# Patient Record
Sex: Male | Born: 1990 | Race: Black or African American | Hispanic: No | Marital: Single | Smoking: Never smoker
Health system: Southern US, Community
[De-identification: ages and names within clinical notes are randomized; demographics above are authoritative.]

## PROBLEM LIST (undated history)

## (undated) ENCOUNTER — Ambulatory Visit: Admission: EM | Payer: BC Managed Care – PPO | Source: Home / Self Care

## (undated) DIAGNOSIS — J45909 Unspecified asthma, uncomplicated: Secondary | ICD-10-CM

## (undated) DIAGNOSIS — E739 Lactose intolerance, unspecified: Secondary | ICD-10-CM

## (undated) DIAGNOSIS — E669 Obesity, unspecified: Secondary | ICD-10-CM

## (undated) DIAGNOSIS — I2699 Other pulmonary embolism without acute cor pulmonale: Secondary | ICD-10-CM

## (undated) HISTORY — PX: NO PAST SURGERIES: SHX2092

---

## 2012-04-08 ENCOUNTER — Emergency Department (HOSPITAL_COMMUNITY)
Admission: EM | Admit: 2012-04-08 | Discharge: 2012-04-08 | Disposition: A | Discharge status: Home / Self Care | Payer: Medicaid Other | Attending: Emergency Medicine | Admitting: Emergency Medicine

## 2012-04-08 ENCOUNTER — Encounter (HOSPITAL_COMMUNITY): Payer: Self-pay | Admitting: Nurse Practitioner

## 2012-04-08 DIAGNOSIS — I889 Nonspecific lymphadenitis, unspecified: Secondary | ICD-10-CM | POA: Insufficient documentation

## 2012-04-08 MED ORDER — CEPHALEXIN 500 MG PO CAPS: 500.0000 mg | ORAL_CAPSULE | Freq: Four times a day (QID) | ORAL | Status: AC

## 2012-04-08 NOTE — ED Provider Notes (Signed)
History     CSN: [id number]  Arrival date & time 04/08/12  1158   First MD Initiated Contact with Patient 04/08/12 1231      Chief Complaint  Patient presents with  . Leg Pain     HPI Pt states he has been experiencing aching in his legs for past year. No known injuries. Reports L leg is especially painful over past several days. State he can "feel a lump" under skin in L anterior thigh. Ambulatory, [facility]  History reviewed. No pertinent past medical history.  History reviewed. No pertinent past surgical history.  History reviewed. No pertinent family history.  History  Substance Use Topics  . Smoking status: Never Smoker   . Smokeless tobacco: Not on file  . Alcohol Use: No      Review of Systems  All other systems reviewed and are negative.    Allergies  Review of patient's allergies indicates no known allergies.  Home Medications   Current Outpatient Rx  Name Route Sig Dispense Refill  . ALBUTEROL SULFATE HFA 108 (90 BASE) MCG/ACT IN AERS Inhalation Inhale 2 puffs into the lungs every 4 (four) hours as needed. For shortness of breath    . OMEPRAZOLE 20 MG PO CPDR Oral Take 20 mg by mouth daily as needed. For reflux    . CEPHALEXIN 500 MG PO CAPS Oral Take 1 capsule (500 mg total) by mouth 4 (four) times daily. 40 capsule 0    BP 114/82  Pulse 69  Temp(Src) 98.1 F (36.7 C) (Oral)  Resp 15  Ht 5\' 10"  (1.778 m)  Wt 210 lb (95.255 kg)  BMI 30.13 kg/m2  SpO2 97%  Physical Exam  Nursing note and vitals reviewed. Constitutional: He is oriented to person, place, and time. He appears well-developed and well-nourished. No distress.  HENT:  Head: Normocephalic and atraumatic.  Eyes: Pupils are equal, round, and reactive to light.  Neck: Normal range of motion.  Cardiovascular: Normal rate and intact distal pulses.   Pulmonary/Chest: No respiratory distress.  Abdominal: Normal appearance. He exhibits no distension.  Musculoskeletal: Normal range of motion.       Legs: Neurological: He is alert and oriented to person, place, and time. No cranial nerve deficit.  Skin: Skin is warm and dry. No rash noted.  Psychiatric: He has a normal mood and affect. His behavior is normal.    ED Course  Procedures (including critical care time)  Labs Reviewed - No data to display No results found.   1. Lymphadenitis       MDM          Nelia Shi, MD 04/10/12 1149

## 2012-04-08 NOTE — ED Notes (Signed)
Pt c/o bilateral leg pain, x 1 year. Pt has small bump to upper left thigh that is under skin. Bilateral legs are non-tender, no redness noted to skin. Pt ambulated to room without difficulty.

## 2012-04-08 NOTE — Discharge Instructions (Signed)
Swollen Lymph Nodes  The lymphatic system filters fluid from around cells. It is like a system of blood vessels. These channels carry lymph instead of blood. The lymphatic system is an important part of the immune (disease fighting) system. When people talk about "swollen glands in the neck," they are usually talking about swollen lymph nodes. The lymph nodes are like the little traps for infection. You and your caregiver may be able to feel lymph nodes, especially swollen nodes, in these common areas: the groin (inguinal area), armpits (axilla), and above the clavicle (supraclavicular). You may also feel them in the neck (cervical) and the back of the head just above the hairline (occipital).  Swollen glands occur when there is any condition in which the body responds with an allergic type of reaction. For instance, the glands in the neck can become swollen from insect bites or any type of minor infection on the head. These are very noticeable in children with only minor problems. Lymph nodes may also become swollen when there is a tumor or problem with the lymphatic system, such as Hodgkin's disease.  TREATMENT    Most swollen glands do not require treatment. They can be observed (watched) for a short period of time, if your caregiver feels it is necessary. Most of the time, observation is not necessary.   Antibiotics (medicines that kill germs) may be prescribed by your caregiver. Your caregiver may prescribe these if he or she feels the swollen glands are due to a bacterial (germ) infection. Antibiotics are not used if the swollen glands are caused by a virus.  HOME CARE INSTRUCTIONS    Take medications as directed by your caregiver. Only take over-the-counter or prescription medicines for pain, discomfort, or fever as directed by your caregiver.  SEEK MEDICAL CARE IF:    If you begin to run a temperature greater than 102 F (38.9 C), or as your caregiver suggests.  MAKE SURE YOU:    Understand these  instructions.   Will watch your condition.   Will get help right away if you are not doing well or get worse.  Document Released: 12/05/2002 Document Revised: 12/04/2011 Document Reviewed: 12/15/2005  Exit[employer] Patient Information 2012 [employer].

## 2012-04-08 NOTE — ED Notes (Signed)
Pt states he has been experiencing aching in his legs for past year. No known injuries. Reports L leg is especially painful over past several days. State he can "feel a lump" under skin in L anterior thigh. Ambulatory, [facility]

## 2012-04-20 ENCOUNTER — Encounter (HOSPITAL_COMMUNITY): Payer: Self-pay | Admitting: *Deleted

## 2012-04-20 ENCOUNTER — Emergency Department (HOSPITAL_COMMUNITY)
Admission: EM | Admit: 2012-04-20 | Discharge: 2012-04-20 | Disposition: A | Discharge status: Home / Self Care | Payer: Medicaid Other | Attending: Emergency Medicine | Admitting: Emergency Medicine

## 2012-04-20 DIAGNOSIS — M79606 Pain in leg, unspecified: Secondary | ICD-10-CM

## 2012-04-20 DIAGNOSIS — M79609 Pain in unspecified limb: Secondary | ICD-10-CM | POA: Insufficient documentation

## 2012-04-20 NOTE — ED Provider Notes (Signed)
History   This chart was scribed for Lyanne Co, MD by Clarita Crane. The patient was seen in room STRE4/STRE4. Patient's care was started at 1546.    CSN: [id number]  Arrival date & time 04/20/12  1546   First MD Initiated Contact with Patient 04/20/12 1620      Chief Complaint  Patient presents with  . lumps on legs     HPI Terry Miles is a 21 y.o. male who presents to the Emergency Department complaining of intermittent moderate pain to bilateral lower extremities onset 2 years ago and persistent since. Patient also notes he palpated a lump on his right thigh yesterday. Denies numbness, tingling, weakness, rash, nausea, vomiting, fever, chills.  Patient reports his pain occurs in different areas of his legs and seems to frequently move.  History reviewed. No pertinent past medical history.  History reviewed. No pertinent past surgical history.  No family history on file.  History  Substance Use Topics  . Smoking status: Never Smoker   . Smokeless tobacco: Not on file  . Alcohol Use: No      Review of Systems  Constitutional: Negative for fever and chills.  Respiratory: Negative for shortness of breath.   Gastrointestinal: Negative for nausea and vomiting.  Musculoskeletal: Positive for myalgias. Negative for arthralgias.  Neurological: Negative for weakness and numbness.  All other systems reviewed and are negative.    Allergies  Review of patient's allergies indicates no known allergies.  Home Medications   Current Outpatient Rx  Name Route Sig Dispense Refill  . ALBUTEROL SULFATE HFA 108 (90 BASE) MCG/ACT IN AERS Inhalation Inhale 2 puffs into the lungs every 4 (four) hours as needed. For shortness of breath    . OMEPRAZOLE 20 MG PO CPDR Oral Take 20 mg by mouth daily as needed. For reflux      BP 110/62  Pulse 60  Temp(Src) 98.2 F (36.8 C) (Oral)  Resp 16  SpO2 99%  Physical Exam  Nursing note and vitals reviewed. Constitutional: He is  oriented to person, place, and time. He appears well-developed and well-nourished. No distress.  HENT:  Head: Normocephalic and atraumatic.  Eyes: EOM are normal. Pupils are equal, round, and reactive to light.  Neck: Neck supple. No tracheal deviation present.  Cardiovascular: Normal rate.   Pulmonary/Chest: Effort normal. No respiratory distress.  Abdominal: Soft. He exhibits no distension.  Musculoskeletal: Normal range of motion. He exhibits no edema.       FROM bilateral lower extremities. DP and PT pulses intact bilaterally. No signs of infection noted to lower extremities. Small punctate mobile lymph node to left groin but no other palpable masses noted to bilateral lower extremities. Bilateral lower extremities are symmetric with no swelling noted.   Neurological: He is alert and oriented to person, place, and time. No sensory deficit.  Skin: Skin is warm and dry. No erythema.  Psychiatric: He has a normal mood and affect. His behavior is normal.    ED Course  Procedures (including critical care time)  DIAGNOSTIC STUDIES: Oxygen Saturation is 99% on room air, normal by my interpretation.    COORDINATION OF CARE: 4:48PM- Patient informed of current plan for treatment and evaluation and agrees with plan at this time.     Labs Reviewed - No data to display No results found.   1. Lower extremity pain       MDM  There is no sign of arterial or venous insufficiency.  He has no signs  of infection.  His legs are normal in appearance.  Medical screening examination performed and there is no life-threatening emergency presents today.  I recommended close followup with her primary care physician      I personally performed the services described in this documentation, which was scribed in my presence. The recorded information has been reviewed and considered.      Lyanne Co, MD 04/20/12 [phone]

## 2012-04-20 NOTE — ED Notes (Signed)
The pt says he has lumps on both his legs and a lump on his lt forearm that he has had for 3-4 weeks intermittently.Terry Miles  He was seen here for the same and was given meds for infectiion

## 2012-04-20 NOTE — Discharge Instructions (Signed)
Pain of Unknown Etiology (Pain Without a Known Cause) You have come to your caregiver because of pain. Pain can occur in any part of the body. Often there is not a definite cause. If your laboratory (blood or urine) work was normal and x-rays or other studies were normal, your caregiver may treat you without knowing the cause of the pain. An example of this is the headache. Most headaches are diagnosed by taking a history. This means your caregiver asks you questions about your headaches. Your caregiver determines a treatment based on your answers. Usually testing done for headaches is normal. Often testing is not done unless there is no response to medications. Regardless of where your pain is located today, you can be given medications to make you comfortable. If no physical cause of pain can be found, most cases of pain will gradually leave as suddenly as they came.  If you have a painful condition and no reason can be found for the pain, It is importantthat you follow up with your caregiver. If the pain becomes worse or does not go away, it may be necessary to repeat tests and look further for a possible cause.  Only take over-the-counter or prescription medicines for pain, discomfort, or fever as directed by your caregiver.   For the protection of your privacy, test results can not be given over the phone. Make sure you receive the results of your test. Ask as to how these results are to be obtained if you have not been informed. It is your responsibility to obtain your test results.   You may continue all activities unless the activities cause more pain. When the pain lessens, it is important to gradually resume normal activities. Resume activities by beginning slowly and gradually increasing the intensity and duration of the activities or exercise. During periods of severe pain, bed-rest may be helpful. Lay or sit in any position that is comfortable.   Ice used for acute (sudden) conditions may be  effective. Use a large plastic bag filled with ice and wrapped in a towel. This may provide pain relief.   See your caregiver for continued problems. They can help or refer you for exercises or physical therapy if necessary.  If you were given medications for your condition, do not drive, operate machinery or power tools, or sign legal documents for 24 hours. Do not drink alcohol, take sleeping pills, or take other medications that may interfere with treatment. See your caregiver immediately if you have pain that is becoming worse and not relieved by medications. Document Released: 09/09/2001 Document Revised: 12/04/2011 Document Reviewed: 12/15/2005 [employer] Patient Information 2012 [employer]. 

## 2012-08-17 LAB — PULMONARY FUNCTION TEST

## 2012-09-06 ENCOUNTER — Other Ambulatory Visit: Payer: Self-pay | Admitting: Internal Medicine

## 2012-09-06 ENCOUNTER — Encounter: Payer: Self-pay | Admitting: *Deleted

## 2012-09-06 DIAGNOSIS — I2510 Atherosclerotic heart disease of native coronary artery without angina pectoris: Secondary | ICD-10-CM

## 2012-09-15 ENCOUNTER — Ambulatory Visit (HOSPITAL_COMMUNITY)
Admission: RE | Admit: 2012-09-15 | Discharge: 2012-09-15 | Disposition: A | Discharge status: Home / Self Care | Payer: Medicaid Other | Source: Ambulatory Visit | Attending: Internal Medicine | Admitting: Internal Medicine

## 2012-09-15 ENCOUNTER — Telehealth: Payer: Self-pay | Admitting: *Deleted

## 2012-09-15 DIAGNOSIS — N62 Hypertrophy of breast: Secondary | ICD-10-CM | POA: Insufficient documentation

## 2012-09-15 DIAGNOSIS — R911 Solitary pulmonary nodule: Secondary | ICD-10-CM | POA: Insufficient documentation

## 2012-09-15 DIAGNOSIS — I2510 Atherosclerotic heart disease of native coronary artery without angina pectoris: Secondary | ICD-10-CM

## 2012-09-15 DIAGNOSIS — R079 Chest pain, unspecified: Secondary | ICD-10-CM | POA: Insufficient documentation

## 2012-09-15 MED ORDER — IOHEXOL 350 MG/ML SOLN
80.0000 mL | Freq: Once | INTRAVENOUS | Status: AC | PRN
  Administered 2012-09-15: 80 mL via INTRAVENOUS

## 2012-09-15 MED ORDER — METOPROLOL TARTRATE 1 MG/ML IV SOLN: 5.0000 mg | Freq: Once | INTRAVENOUS | Status: DC

## 2012-09-15 MED ORDER — NITROGLYCERIN 0.4 MG SL SUBL
SUBLINGUAL_TABLET | SUBLINGUAL | Status: AC
  Filled 2012-09-15: qty 25

## 2012-09-15 MED ORDER — METOPROLOL TARTRATE 1 MG/ML IV SOLN
INTRAVENOUS | Status: AC
  Administered 2012-09-15: 5 mg
  Filled 2012-09-15: qty 5

## 2012-09-15 MED ORDER — NITROGLYCERIN 0.4 MG SL SUBL
0.4000 mg | SUBLINGUAL_TABLET | Freq: Once | SUBLINGUAL | Status: AC
  Administered 2012-09-15: 0.4 mg via SUBLINGUAL

## 2012-09-15 NOTE — Telephone Encounter (Addendum)
Spoke with Terry Miles 09/14/12 concerning Cardiac CT appointment. An E-mail correspondence has been sent to the patient  Verify the change in his appointment. Patient agreed. See E-mail below.  Terry Miles, I am so sorry, but we had to change your appointment time on 09/15/12 from 1pm to 2pm. Instead of   fasting after 9 am, fast after 10 am.

## 2012-09-16 DIAGNOSIS — R079 Chest pain, unspecified: Secondary | ICD-10-CM

## 2013-07-07 ENCOUNTER — Ambulatory Visit: Payer: Self-pay | Admitting: Nurse Practitioner

## 2015-04-22 ENCOUNTER — Ambulatory Visit
Admit: 2015-04-22 | Disposition: A | Discharge status: Home / Self Care | Payer: Self-pay | Attending: Family Medicine | Admitting: Family Medicine

## 2015-04-23 ENCOUNTER — Ambulatory Visit
Admit: 2015-04-23 | Disposition: A | Discharge status: Home / Self Care | Payer: Self-pay | Attending: Internal Medicine | Admitting: Internal Medicine

## 2015-07-17 ENCOUNTER — Encounter: Payer: Self-pay | Admitting: *Deleted

## 2015-07-27 ENCOUNTER — Encounter: Payer: Self-pay | Admitting: Emergency Medicine

## 2015-07-27 ENCOUNTER — Ambulatory Visit
Admission: EM | Admit: 2015-07-27 | Discharge: 2015-07-27 | Discharge status: Absent without leave | Payer: Medicaid Other

## 2015-07-27 ENCOUNTER — Ambulatory Visit
Admission: EM | Admit: 2015-07-27 | Discharge: 2015-07-27 | Disposition: A | Discharge status: Home / Self Care | Payer: BLUE CROSS/BLUE SHIELD | Attending: Emergency Medicine | Admitting: Emergency Medicine

## 2015-07-27 DIAGNOSIS — R1900 Intra-abdominal and pelvic swelling, mass and lump, unspecified site: Secondary | ICD-10-CM | POA: Diagnosis not present

## 2015-07-27 NOTE — ED Provider Notes (Signed)
HPI  SUBJECTIVE:  Terry Miles is a 24 y.o. male who presents with nontender mass in his abdominal wall for at least 2 months. He states that it is not changed in size since he noticed it. There are no aggravating or alleviating factors. He has not tried anything for this. No nausea, vomiting, fevers, trauma to the area, erythema, no drainage. He reports sharp, occasional, migratory abdominal pain, but states that he has had this "forever". States that it is not different than his baseline. Past medical history of palpitations. No history of diabetes, hypertension.   History reviewed. No pertinent past medical history.  History reviewed. No pertinent past surgical history.  History reviewed. No pertinent family history.  History  Substance Use Topics  . Smoking status: Never Smoker   . Smokeless tobacco: Not on file  . Alcohol Use: No    No current facility-administered medications for this encounter. No current outpatient prescriptions on file.  Allergies  Allergen Reactions  . Shellfish Allergy      ROS  As noted in HPI.   Physical Exam  BP 116/58 mmHg  Pulse 58  Temp(Src) 98.1 F (36.7 C) (Oral)  Resp 16  SpO2 100%  Constitutional: Well developed, well nourished, no acute distress Eyes:  EOMI, conjunctiva normal bilaterally HENT: Normocephalic, atraumatic,mucus membranes moist Respiratory: Normal inspiratory effort Cardiovascular: Normal rate GI: Approximately 0.5 x 1 cm nontender mass subcutaneous tissue lateral left abdomen. Normal appearance. no erythema, swelling, expressible purulent drainage. Abdomen otherwise soft, nontender nondistended, normal bowel sounds. skin: No rash, skin intact Musculoskeletal: no deformities Neurologic: Alert & oriented x 3, no focal neuro deficits Psychiatric: Speech and behavior appropriate   ED Course   Medications - No data to display  No orders of the defined types were placed in this encounter.    No  results found for this or any previous visit (from the past 24 hour(s)). No results found.  ED Clinical Impression  Mass of abdomen   ED Assessment/Plan Feel that this is either a lipoma or small amounts of scar tissue/fibrosis. There is no evidence of a hernia. Since this is largely asymptomatic, advised patient to monitor this. Feel that labs, imaging would be of limited use at this time. He is to follow-up with his primary care physician if it starts becoming larger, for any signs of infection. Discussed MDM, plan and followup with patient.  Patient agrees with plan.  *This clinic note was created using Dragon dictation software. Therefore, there may be occasional mistakes despite careful proofreading.  ?   Domenick Gong, MD 07/27/15 1312

## 2015-07-27 NOTE — ED Notes (Signed)
Pt has a small lump on left side of abdomen pt states that it has been there for 2 months at least.

## 2015-07-27 NOTE — Discharge Instructions (Signed)
This is most likely a lipoma or a small amount of scar tissue, both of which are benign, and do not require further evaluation and intervention at this time. Here is some information about lipomas. Follow-up with your primary care physician if it starts getting bigger, painful, red, if you start having drainage from the area, or other concerns.

## 2015-07-30 ENCOUNTER — Encounter: Payer: Self-pay | Admitting: *Deleted

## 2015-08-07 ENCOUNTER — Encounter: Payer: Self-pay | Admitting: Internal Medicine

## 2016-10-10 ENCOUNTER — Ambulatory Visit
Admission: EM | Admit: 2016-10-10 | Discharge: 2016-10-10 | Disposition: A | Discharge status: Home / Self Care | Payer: BLUE CROSS/BLUE SHIELD | Attending: Family Medicine | Admitting: Family Medicine

## 2016-10-10 DIAGNOSIS — H6122 Impacted cerumen, left ear: Secondary | ICD-10-CM

## 2016-10-10 NOTE — ED Provider Notes (Signed)
[facility] ____________________________________________  Time seen: Approximately 9:11 AM  I have reviewed the triage vital signs and the nursing notes.   HISTORY  Chief Complaint Ear Fullness (Left Ear)   HPI Terry Miles is a 25 y.o. male presents with a complaint of left ear feeling clogged. Patient states his hearing is left ear feels muffled. Patient reports that he does often have wax buildup in his ears that feels similarly. Denies pain. Denies ringing in his ears. Denies recent cough, congestion, fevers or sickness. Denies dizziness. Patient reports that he feels well otherwise. Patient reports that he did try using an over-the-counter kit that he got from [facility] that involved drops and trying to suction wax out without any improvement.    History reviewed. No pertinent past medical history.  There are no active problems to display for this patient.   History reviewed. No pertinent surgical history.  Current Outpatient Rx  . Order #: [id number] Class: Historical Med  . Order #: [id number] Class: Historical Med    No current facility-administered medications for this encounter.   Current Outpatient Prescriptions:  .  omeprazole (PRILOSEC) 20 MG capsule, Take 20 mg by mouth daily as needed. For reflux, Disp: , Rfl:  .  albuterol (PROVENTIL HFA;VENTOLIN HFA) 108 (90 BASE) MCG/ACT inhaler, Inhale 2 puffs into the lungs every 4 (four) hours as needed. For shortness of breath, Disp: , Rfl:   Allergies Shellfish allergy and Shrimp [shellfish allergy]  Family History  Problem Relation Age of Onset  . Hypertension Maternal Grandmother   . Heart disease Maternal Grandmother   . Kidney disease Maternal Grandmother     Social History Social History  Substance Use Topics  . Smoking status: Never Smoker  . Smokeless tobacco: Never Used  . Alcohol use No    Review of Systems Constitutional: No fever/chills Eyes: No visual changes. ENT: No sore throat. As  above. Cardiovascular: Denies chest pain. Respiratory: Denies shortness of breath. Gastrointestinal: No abdominal pain.  No nausea, no vomiting.  No diarrhea.  No constipation. Genitourinary: Negative for dysuria. Musculoskeletal: Negative for back pain. Skin: Negative for rash. Neurological: Negative for headaches, focal weakness or numbness.  10-point ROS otherwise negative.  ____________________________________________   PHYSICAL EXAM:  VITAL SIGNS: ED Triage Vitals  Enc Vitals Group     BP 10/10/16 0905 (!) 118/58     Pulse Rate 10/10/16 0905 60     Resp 10/10/16 0905 16     Temp 10/10/16 0905 98.1 F (36.7 C)     Temp Source 10/10/16 0905 Oral     SpO2 10/10/16 0905 99 %     Weight 10/10/16 0904 220 lb (99.8 kg)     Height 10/10/16 0904 '5\' 11"'$  (1.803 m)     Head Circumference --      Peak Flow --      Pain Score 10/10/16 0904 0     Pain Loc --      Pain Edu? --      Excl. in [place]? --     Constitutional: Alert and oriented. Well appearing and in no acute distress. Eyes: Conjunctivae are normal. PERRL. EOMI. ENT      Head: Normocephalic and atraumatic.      Ears: Right: nontender, mild cerumen present, no erythema, normal TM. Left: nontender, left total cerumen impaction, post cerumen removal canal clear, no erythema  And normal TM.       Nose: No congestion/rhinnorhea.      Mouth/Throat: Mucous membranes are moist.Oropharynx  non-erythematous. Neck: No stridor. Supple without meningismus.  Hematological/Lymphatic/Immunilogical: No cervical lymphadenopathy. Cardiovascular: Normal rate, regular rhythm. Grossly normal heart sounds.  Good peripheral circulation. Respiratory: Normal respiratory effort without tachypnea nor retractions. Breath sounds are clear and equal bilaterally. No wheezes/rales/rhonchi.. Musculoskeletal:  Ambulatory with steady gait. Neurologic:  Normal speech and language. Skin:  Skin is warm, dry and intact. No rash noted. Psychiatric: Mood and  affect are normal. Speech and behavior are normal. Patient exhibits appropriate insight and judgment   ___________________________________________   LABS (all labs ordered are listed, but only abnormal results are displayed)  Labs Reviewed - No data to display ____________________________________________  PROCEDURES Procedures   Ceruminosis is noted.  Wax is removed by irrigation by RN. Instructions for home care to prevent wax buildup are given.  INITIAL IMPRESSION / ASSESSMENT AND PLAN / ED COURSE  Pertinent labs & imaging results that were available during my care of the patient were reviewed by me and considered in my medical decision making (see chart for details).  Well appearing patient. No acute distress. Presents for the complaints of left ear fullness and clogged sensation. Left total cerumen impaction. After cerumen impaction removal with irrigation, canal clear and patient reports feeling much better.  Discussed follow up with Primary care physician this week. Discussed follow up and return parameters including no resolution or any worsening concerns. Patient verbalized understanding and agreed to plan.   ____________________________________________   FINAL CLINICAL IMPRESSION(S) / ED DIAGNOSES  Final diagnoses:  Impacted cerumen of left ear     New Prescriptions   No medications on file    Note: This dictation was prepared with Dragon dictation along with smaller phrase technology. Any transcriptional errors that result from this process are unintentional.    Clinical Course      Marylene Land, NP 10/10/16 [phone]

## 2016-10-10 NOTE — ED Triage Notes (Signed)
Pt c/o not being able to hear out of left ear. Believes its full of wax

## 2016-10-17 IMAGING — CR DG HAND COMPLETE 3+V*L*
3 series · 3 of 3 positions shown · non-contrast
Comparison: None.

CLINICAL DATA: Chronic pain along palmar aspect left hand

EXAM:
LEFT HAND - COMPLETE 3+ VIEW

[hand ap]
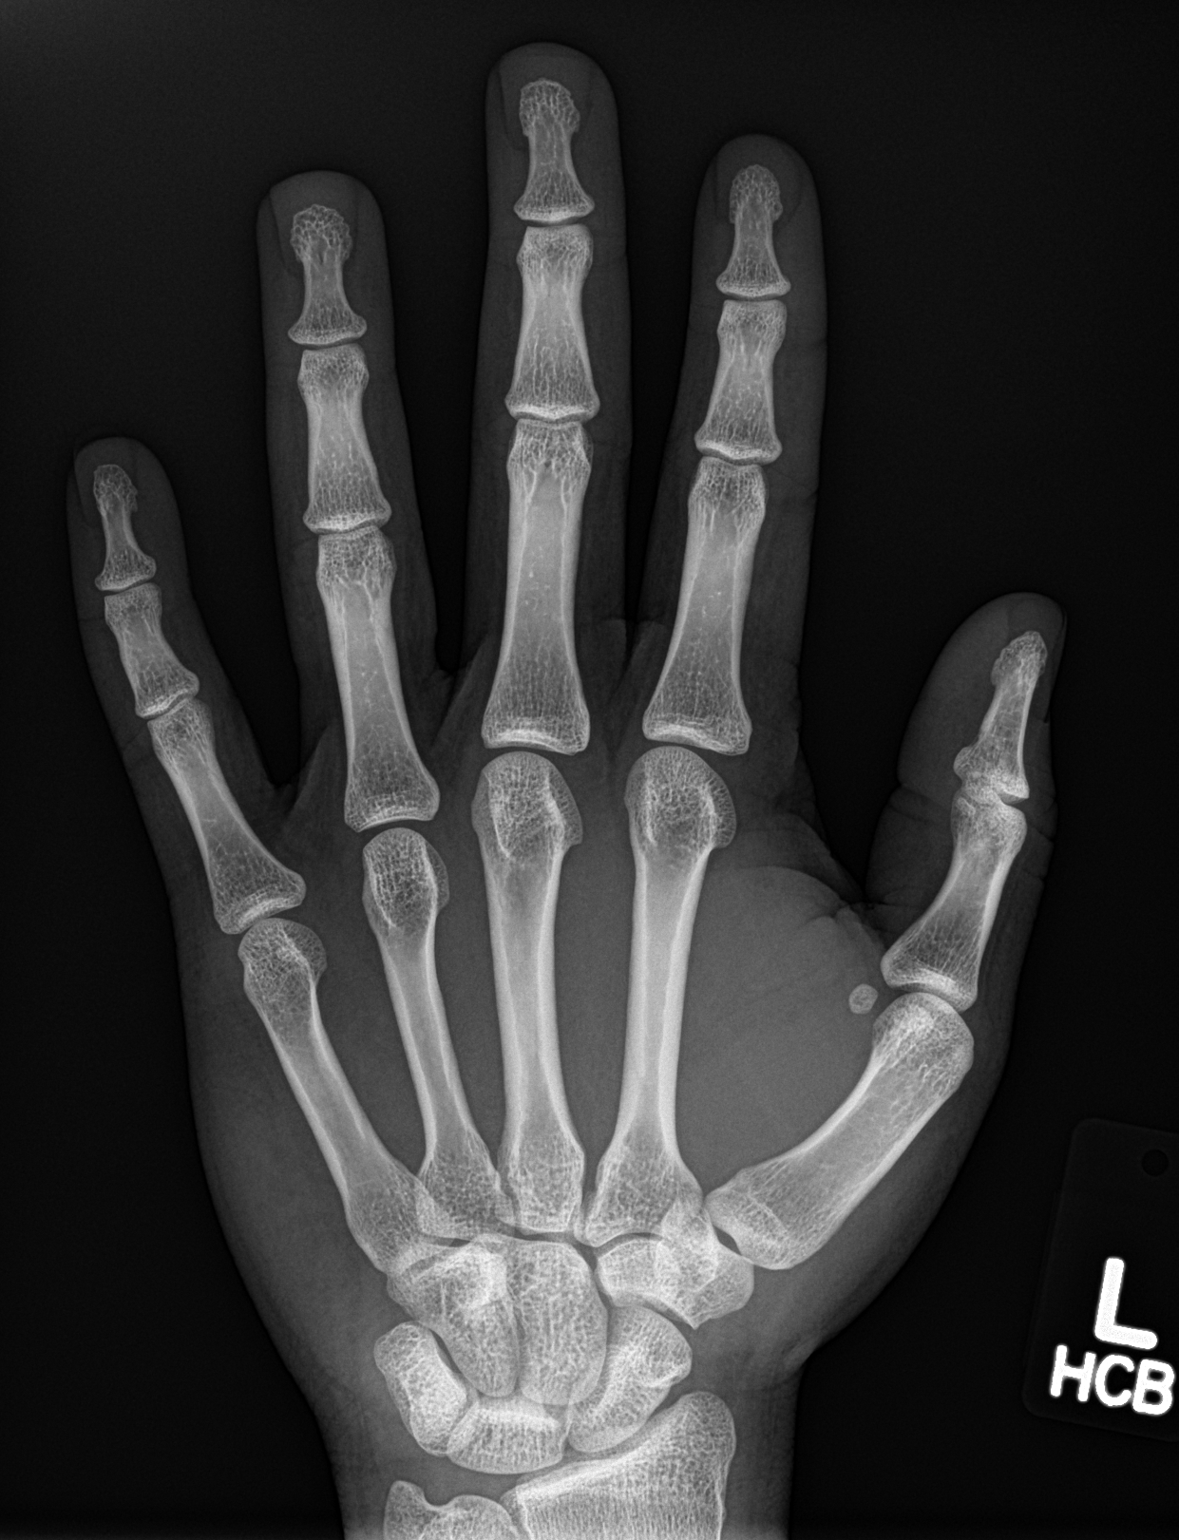

[hand obl]
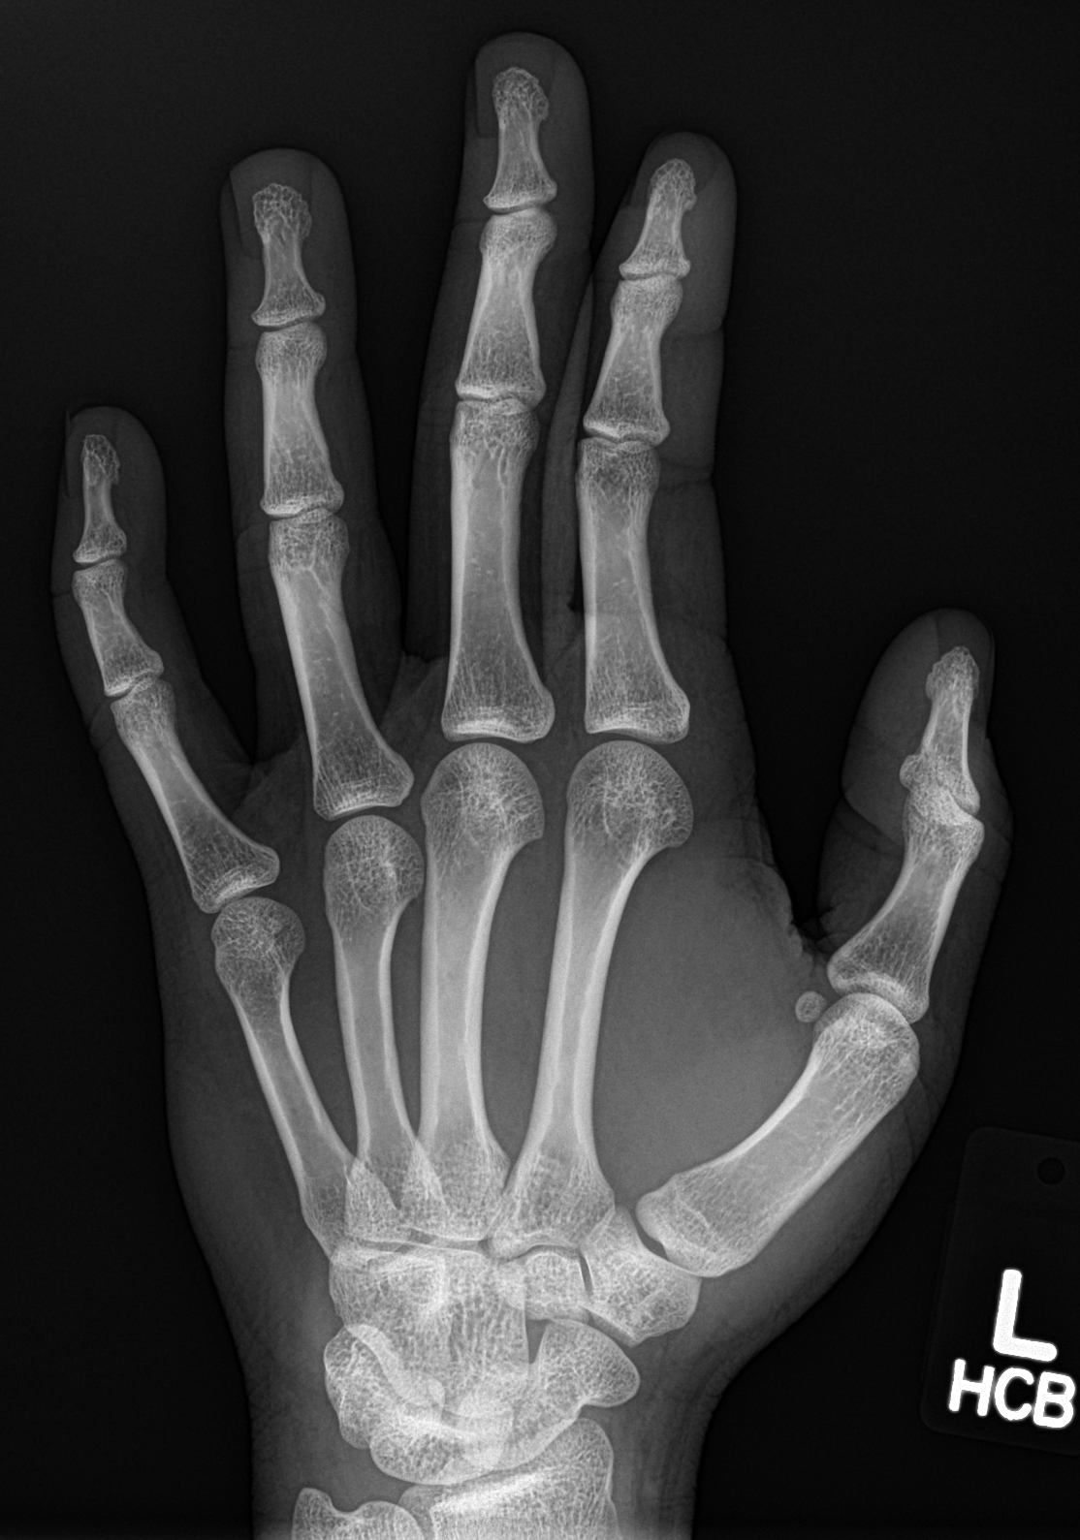

[hand lat]
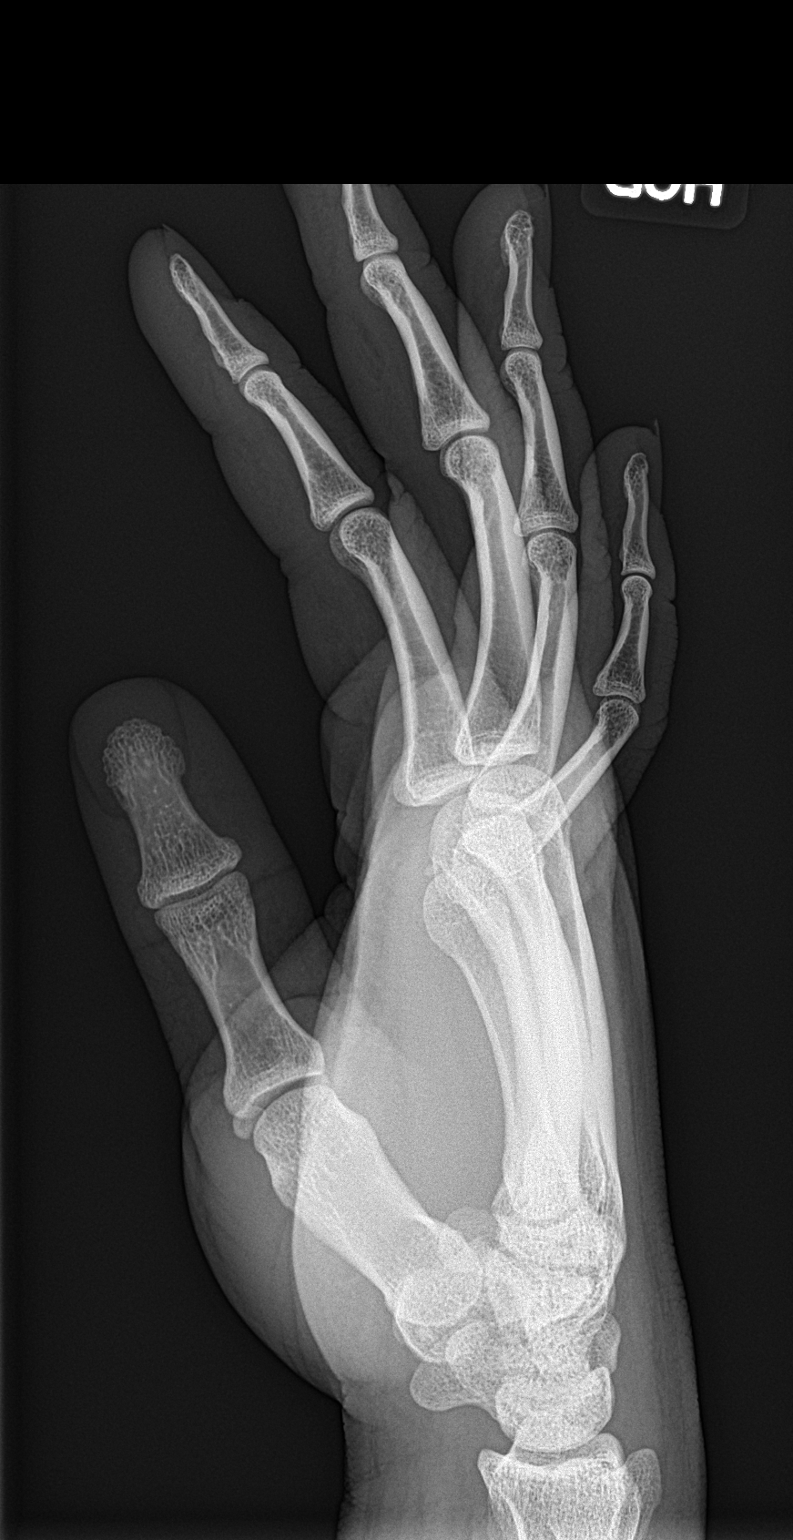

[3 of 3 positions shown; findings below may reference images not displayed]

FINDINGS: Frontal, oblique, and lateral views obtained. There is no fracture
or dislocation. Joint spaces appear intact. No erosive change. No
soft tissue mass or calcifications seen.
IMPRESSION: No abnormality noted.

## 2016-10-17 IMAGING — CR DG FOREARM 2V*L*
2 series · 2 of 2 positions shown · non-contrast
Comparison: None.

CLINICAL DATA: Soft tissue prominence left anterior forearm. Pain
with lifting

EXAM:
LEFT FOREARM - 2 VIEW

[forearm ap]
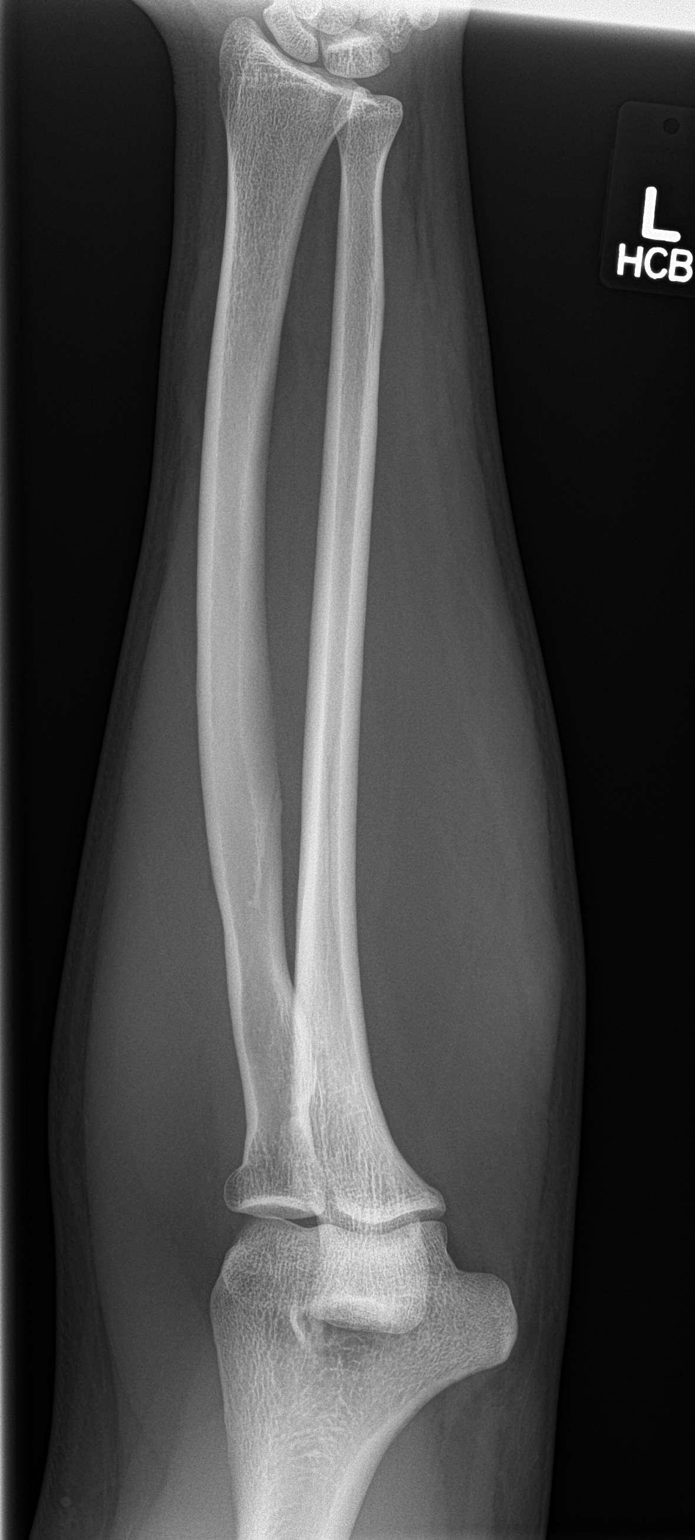

[forearm lat]
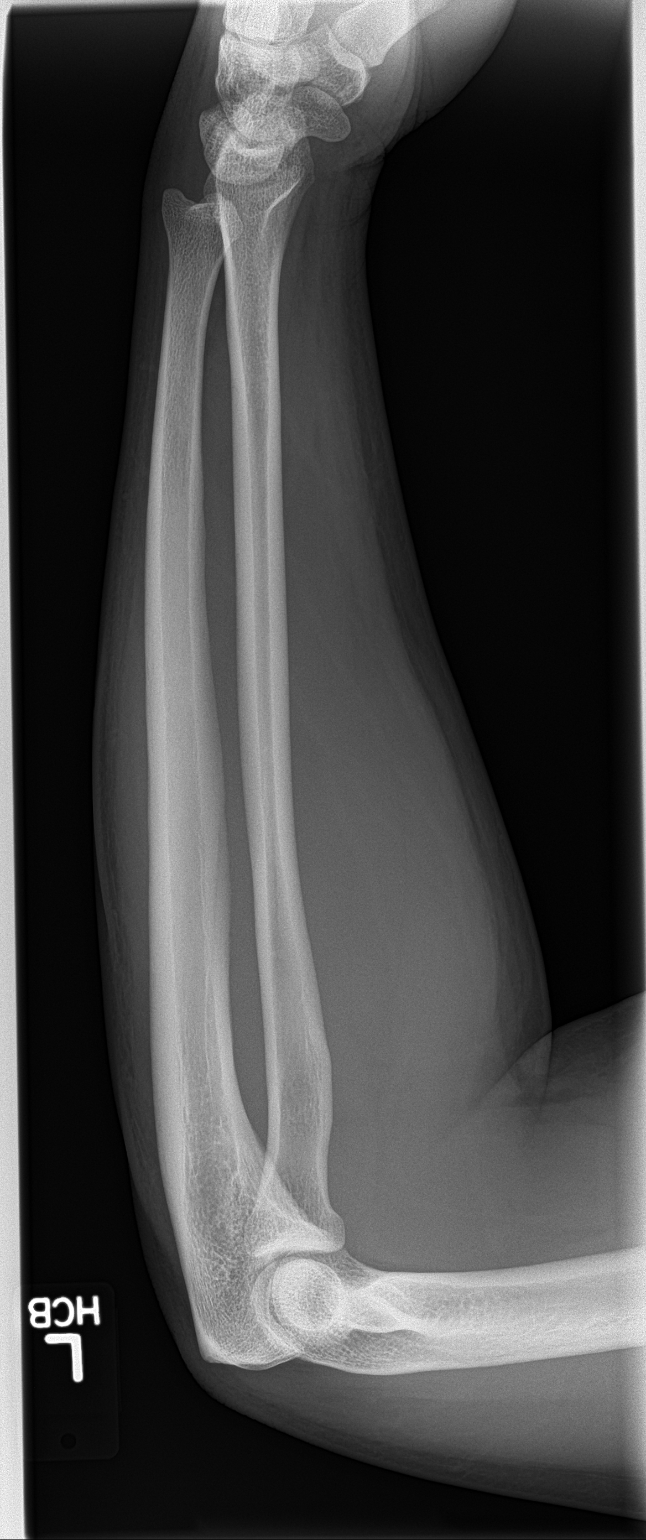

[2 of 2 positions shown; findings below may reference images not displayed]

FINDINGS: Frontal and lateral views were obtained. No fracture or dislocation.
Joint spaces appear intact. Incidental note is made of a minus ulnar
variant. No soft tissue mass or calcifications seen.
IMPRESSION: No fracture or dislocation. No appreciable arthropathy. No soft
tissue mass appreciable. MR would be the imaging study of choice to
further evaluate soft tissue mass in this region.

## 2016-11-28 ENCOUNTER — Ambulatory Visit
Admission: EM | Admit: 2016-11-28 | Discharge: 2016-11-28 | Disposition: A | Discharge status: Home / Self Care | Payer: BLUE CROSS/BLUE SHIELD | Attending: Family Medicine | Admitting: Family Medicine

## 2016-11-28 DIAGNOSIS — R253 Fasciculation: Secondary | ICD-10-CM

## 2016-11-28 LAB — CBC WITH DIFFERENTIAL/PLATELET
BASOS ABS: 0.1 10*3/uL (ref 0–0.1)
Basophils Relative: 1 %
Eosinophils Absolute: 0.1 10*3/uL (ref 0–0.7)
Eosinophils Relative: 2 %
HEMATOCRIT: 48.8 % (ref 40.0–52.0)
HEMOGLOBIN: 16.2 g/dL (ref 13.0–18.0)
LYMPHS PCT: 32 %
Lymphs Abs: 2.1 10*3/uL (ref 1.0–3.6)
MCH: 28.9 pg (ref 26.0–34.0)
MCHC: 33.1 g/dL (ref 32.0–36.0)
MCV: 87.2 fL (ref 80.0–100.0)
MONO ABS: 0.7 10*3/uL (ref 0.2–1.0)
MONOS PCT: 11 %
NEUTROS ABS: 3.6 10*3/uL (ref 1.4–6.5)
NEUTROS PCT: 54 %
Platelets: 266 10*3/uL (ref 150–440)
RBC: 5.6 MIL/uL (ref 4.40–5.90)
RDW: 13.6 % (ref 11.5–14.5)
WBC: 6.5 10*3/uL (ref 3.8–10.6)

## 2016-11-28 LAB — BASIC METABOLIC PANEL
ANION GAP: 9 (ref 5–15)
BUN: 9 mg/dL (ref 6–20)
CHLORIDE: 106 mmol/L (ref 101–111)
CO2: 22 mmol/L (ref 22–32)
Calcium: 9 mg/dL (ref 8.9–10.3)
Creatinine, Ser: 1.1 mg/dL (ref 0.61–1.24)
GFR calc Af Amer: >60 mL/min (ref >60)
GFR calc non Af Amer: >60 mL/min (ref >60)
GLUCOSE: 96 mg/dL (ref 65–99)
POTASSIUM: 4.1 mmol/L (ref 3.5–5.1)
Sodium: 137 mmol/L (ref 135–145)

## 2016-11-28 LAB — MAGNESIUM: Magnesium: 2.2 mg/dL (ref 1.7–2.4)

## 2016-11-28 MED ORDER — CYCLOBENZAPRINE HCL 10 MG PO TABS: 10.0000 mg | ORAL_TABLET | Freq: Every day | ORAL | 0 refills | Status: DC

## 2016-11-28 NOTE — Discharge Instructions (Signed)
Hydrate and increased fresh vegetables and fruit

## 2016-11-28 NOTE — ED Provider Notes (Signed)
[facility]    CSN: [id number] Arrival date & time: 11/28/16  [id number]     History   Chief Complaint Chief Complaint  Patient presents with  . Facial Pain    HPI Terry Miles is a 25 y.o. male.   25 yo male with a c/o intermittent facial muscle twitching for months. Denies any injuries, fevers, chills, rash.    The history is provided by the patient.    History reviewed. No pertinent past medical history.  There are no active problems to display for this patient.   History reviewed. No pertinent surgical history.     Home Medications    Prior to Admission medications   Medication Sig Start Date End Date Taking? Authorizing Provider  cyclobenzaprine (FLEXERIL) 10 MG tablet Take 1 tablet (10 mg total) by mouth at bedtime. 11/28/16   Terry Miles Milda Lindvall, MD    Family History Family History  Problem Relation Age of Onset  . Hypertension Maternal Grandmother   . Heart disease Maternal Grandmother   . Kidney disease Maternal Grandmother     Social History Social History  Substance Use Topics  . Smoking status: Never Smoker  . Smokeless tobacco: Never Used  . Alcohol use No     Allergies   Shellfish allergy and Shrimp [shellfish allergy]   Review of Systems Review of Systems   Physical Exam Triage Vital Signs ED Triage Vitals  Enc Vitals Group     BP 11/28/16 0830 136/66     Pulse Rate 11/28/16 0830 67     Resp 11/28/16 0830 18     Temp 11/28/16 0830 98.2 F (36.8 C)     Temp Source 11/28/16 0830 Oral     SpO2 11/28/16 0830 100 %     Weight 11/28/16 0829 220 lb (99.8 kg)     Height 11/28/16 0829 5\' 11"  (1.803 m)     Head Circumference --      Peak Flow --      Pain Score 11/28/16 0831 4     Pain Loc --      Pain Edu? --      Excl. in GC? --    No data found.   Updated Vital Signs BP 136/66 (BP Location: Left Arm)   Pulse 67   Temp 98.2 F (36.8 C) (Oral)   Resp 18   Ht 5\' 11"  (1.803 m)   Wt 220 lb (99.8 kg)   SpO2 100%    BMI 30.68 kg/m   Visual Acuity Right Eye Distance:   Left Eye Distance:   Bilateral Distance:    Right Eye Near:   Left Eye Near:    Bilateral Near:     Physical Exam  Constitutional: He is oriented to person, place, and time. He appears well-developed and well-nourished. No distress.  HENT:  Head: Normocephalic and atraumatic.  Right Ear: Tympanic membrane, external ear and ear canal normal.  Left Ear: Tympanic membrane, external ear and ear canal normal.  Nose: Nose normal.  Mouth/Throat: Uvula is midline, oropharynx is clear and moist and mucous membranes are normal. No oropharyngeal exudate or tonsillar abscesses.  Eyes: Conjunctivae and EOM are normal. Pupils are equal, round, and reactive to light. Right eye exhibits no discharge. Left eye exhibits no discharge. No scleral icterus.  Neck: Normal range of motion. Neck supple. No tracheal deviation present. No thyromegaly present.  Cardiovascular: Normal rate, regular rhythm and normal heart sounds.   Pulmonary/Chest: Effort normal and breath sounds normal.  No stridor. No respiratory distress. He has no wheezes. He has no rales. He exhibits no tenderness.  Musculoskeletal: He exhibits no edema or deformity.  Lymphadenopathy:    He has no cervical adenopathy.  Neurological: He is alert and oriented to person, place, and time. He displays normal reflexes. No cranial nerve deficit or sensory deficit. He exhibits normal muscle tone. Coordination normal.  Skin: Skin is warm and dry. No rash noted. He is not diaphoretic.  Nursing note and vitals reviewed.    UC Treatments / Results  Labs (all labs ordered are listed, but only abnormal results are displayed) Labs Reviewed  BASIC METABOLIC PANEL  MAGNESIUM  CBC WITH DIFFERENTIAL/PLATELET    EKG  EKG Interpretation None       Radiology No results found.  Procedures Procedures (including critical care time)  Medications Ordered in UC Medications - No data to  display   Initial Impression / Assessment and Plan / UC Course  I have reviewed the triage vital signs and the nursing notes.  Pertinent labs & imaging results that were available during my care of the patient were reviewed by me and considered in my medical decision making (see chart for details).  Clinical Course       Final Clinical Impressions(s) / UC Diagnoses   Final diagnoses:  Muscle twitching    New Prescriptions Discharge Medication List as of 11/28/2016  9:41 AM    START taking these medications   Details  cyclobenzaprine (FLEXERIL) 10 MG tablet Take 1 tablet (10 mg total) by mouth at bedtime., Starting Fri 11/28/2016, Normal       1. Lab results and diagnosis reviewed with patient 2. rx as per orders above; reviewed possible side effects, interactions, risks and benefits  3. Recommend supportive treatment with increased fluids 4. Follow-up prn   Terry Miles Terry Cerino, MD 11/28/16 [phone]

## 2016-11-28 NOTE — ED Triage Notes (Addendum)
Pt c/o twitching on his face and other parts of his body. He also states that his big toe has been numb for a long time. He came in today because it is constant it usual comes and goes.

## 2016-12-01 ENCOUNTER — Telehealth: Payer: Self-pay

## 2016-12-01 NOTE — Telephone Encounter (Signed)
Courtesy call back completed today for patients recent visit at [facility]. Patient did not answer, left message on voicemail to call back with any questions or concerns.   

## 2017-06-23 DIAGNOSIS — E669 Obesity, unspecified: Secondary | ICD-10-CM | POA: Insufficient documentation

## 2017-12-24 ENCOUNTER — Ambulatory Visit
Admission: EM | Admit: 2017-12-24 | Discharge: 2017-12-24 | Disposition: A | Discharge status: Home / Self Care | Payer: BLUE CROSS/BLUE SHIELD | Attending: Family Medicine | Admitting: Family Medicine

## 2017-12-24 ENCOUNTER — Other Ambulatory Visit: Payer: Self-pay

## 2017-12-24 DIAGNOSIS — Z113 Encounter for screening for infections with a predominantly sexual mode of transmission: Secondary | ICD-10-CM | POA: Diagnosis not present

## 2017-12-24 DIAGNOSIS — N50819 Testicular pain, unspecified: Secondary | ICD-10-CM | POA: Diagnosis not present

## 2017-12-24 DIAGNOSIS — Z202 Contact with and (suspected) exposure to infections with a predominantly sexual mode of transmission: Secondary | ICD-10-CM

## 2017-12-24 HISTORY — DX: Other pulmonary embolism without acute cor pulmonale: I26.99

## 2017-12-24 LAB — CHLAMYDIA/NGC RT PCR (ARMC ONLY)
CHLAMYDIA TR: DETECTED — AB
N GONORRHOEAE: NOT DETECTED

## 2017-12-24 MED ORDER — AZITHROMYCIN 500 MG PO TABS
1000.0000 mg | ORAL_TABLET | Freq: Once | ORAL | Status: AC
  Administered 2017-12-24: 1000 mg via ORAL

## 2017-12-24 MED ORDER — CEFTRIAXONE SODIUM 250 MG IJ SOLR
250.0000 mg | Freq: Once | INTRAMUSCULAR | Status: AC
  Administered 2017-12-24: 250 mg via INTRAMUSCULAR

## 2017-12-24 NOTE — ED Triage Notes (Signed)
Pt here for STD testing after unprotected sex with someone 4 weeks ago. He found out recently that one of his friends also slept with same person and was diagnosed Chlamydia. Pt reports no sx as yet.

## 2017-12-24 NOTE — Discharge Instructions (Signed)
We will call with your results.  Take care  Dr. Darol Cush  

## 2017-12-24 NOTE — ED Provider Notes (Signed)
[facility]    CSN: [id number] Arrival date & time: 12/24/17  1841  History   Chief Complaint Chief Complaint  Patient presents with  . Exposure to STD   HPI  26 year old male presents with STD exposure.  Patient reports that approximately 3 weeks ago he had unprotected sex.  1 of his friends is also had unprotected sex with this particular male.  He was recently told that his friend tested positive for chlamydia and he is concerned that he has been exposed.  He states that he is confident that he has been exposed to chlamydia and is concerned that he has it.  He states that he has had some testicle pain but is not sure if this is playing a role.  He is also had some "moistness" around his penis.  No reports of penile drainage.  He is uncircumcised.  No reports of inguinal lymphadenopathy.  No other reported symptoms.  No other complaints or concerns at this time.  Patient desires STD screening today.  Patient does not want blood testing as he recently had this a few weeks ago.  Past Medical History:  Diagnosis Date  . Pulmonary emboli (HCC)    Surgical hx - No past surgeries.  Home Medications    Prior to Admission medications   Medication Sig Start Date End Date Taking? Authorizing Provider  rivaroxaban (XARELTO) 20 MG TABS tablet Take 20 mg by mouth daily with supper.   Yes [provider]    Family History Family History  Problem Relation Age of Onset  . Hypertension Maternal Grandmother   . Heart disease Maternal Grandmother   . Kidney disease Maternal Grandmother     Social History Social History   Tobacco Use  . Smoking status: Never Smoker  . Smokeless tobacco: Never Used  Substance Use Topics  . Alcohol use: Yes    Comment: social  . Drug use: No    Allergies   Shellfish allergy and Shrimp [shellfish allergy]   Review of Systems Review of Systems  Constitutional: Negative.   Genitourinary: Positive for testicular pain.  Negative for discharge and scrotal swelling.   Physical Exam Triage Vital Signs ED Triage Vitals  Enc Vitals Group     BP 12/24/17 1910 126/74     Pulse Rate 12/24/17 1910 80     Resp 12/24/17 1910 16     Temp 12/24/17 1910 98.9 F (37.2 C)     Temp Source 12/24/17 1910 Oral     SpO2 12/24/17 1910 99 %     Weight 12/24/17 1907 268 lb (121.6 kg)     Height 12/24/17 1907 6' (1.829 m)     Head Circumference --      Peak Flow --      Pain Score --      Pain Loc --      Pain Edu? --      Excl. in GC? --    Updated Vital Signs BP 126/74 (BP Location: Left Arm)   Pulse 80   Temp 98.9 F (37.2 C) (Oral)   Resp 16   Ht 6' (1.829 m)   Wt 268 lb (121.6 kg)   SpO2 99%   BMI 36.35 kg/m     Physical Exam  Constitutional: He is oriented to person, place, and time. He appears well-developed. No distress.  Cardiovascular: Normal rate and regular rhythm.  No murmur heard. Pulmonary/Chest: Effort normal and breath sounds normal. He has no wheezes. He has no  rales.  Genitourinary:  Genitourinary Comments: Normal uncircumcised penis.  No penile discharge. Normal testicles.  No testicular pain with palpation.  No adenopathy.  Neurological: He is alert and oriented to person, place, and time.  Psychiatric: He has a normal mood and affect. His behavior is normal.  Nursing note and vitals reviewed.  UC Treatments / Results  Labs (all labs ordered are listed, but only abnormal results are displayed) Labs Reviewed  CHLAMYDIA/NGC RT PCR [facility] ONLY)    EKG  EKG Interpretation None       Radiology No results found.  Procedures Procedures (including critical care time)  Medications Ordered in UC Medications  azithromycin (ZITHROMAX) tablet 1,000 mg (1,000 mg Oral Given 12/24/17 1934)  cefTRIAXone (ROCEPHIN) injection 250 mg (250 mg Intramuscular Given 12/24/17 1931)     Initial Impression / Assessment and Plan / UC Course  I have reviewed the triage vital signs  and the nursing notes.  Pertinent labs & imaging results that were available during my care of the patient were reviewed by me and considered in my medical decision making (see chart for details).     26 year old male presents with STD exposure.  We discussed empiric treatment versus waiting on the results of his testing and he wanted empiric treatment.  Patient given azithromycin and ceftriaxone today.  Patient declined HIV and RPR testing.  Final Clinical Impressions(s) / UC Diagnoses   Final diagnoses:  STD exposure    ED Discharge Orders    None     Controlled Substance Prescriptions [place] Controlled Substance Registry consulted? Not Applicable   Tommie SamsCook, Daejon Lich G, DO 12/24/17 1943

## 2018-02-03 ENCOUNTER — Other Ambulatory Visit: Payer: Self-pay

## 2018-02-03 ENCOUNTER — Ambulatory Visit
Admission: EM | Admit: 2018-02-03 | Discharge: 2018-02-03 | Disposition: A | Discharge status: Home / Self Care | Payer: BLUE CROSS/BLUE SHIELD | Attending: Family Medicine | Admitting: Family Medicine

## 2018-02-03 ENCOUNTER — Encounter: Payer: Self-pay | Admitting: *Deleted

## 2018-02-03 DIAGNOSIS — L989 Disorder of the skin and subcutaneous tissue, unspecified: Secondary | ICD-10-CM | POA: Diagnosis not present

## 2018-02-03 DIAGNOSIS — M7989 Other specified soft tissue disorders: Secondary | ICD-10-CM | POA: Diagnosis not present

## 2018-02-03 DIAGNOSIS — R2231 Localized swelling, mass and lump, right upper limb: Secondary | ICD-10-CM

## 2018-02-03 NOTE — ED Provider Notes (Signed)
[facility]    CSN: [id number] Arrival date & time: 02/03/18  1426     History   Chief Complaint Chief Complaint  Patient presents with  . Arm Problem    HPI Terry Miles is a 27 y.o. male.   27 yo male with a c/o a "knot" or "lump" on his forearm skin that he noticed 2 days ago. Denies any trauma, injuries, fevers, chills, redness, pain, drainage.    The history is provided by the patient.    Past Medical History:  Diagnosis Date  . Pulmonary emboli (HCC)     There are no active problems to display for this patient.   History reviewed. No pertinent surgical history.     Home Medications    Prior to Admission medications   Medication Sig Start Date End Date Taking? Authorizing Provider  rivaroxaban (XARELTO) 20 MG TABS tablet Take 20 mg by mouth daily with supper.    [provider]    Family History Family History  Problem Relation Age of Onset  . Hypertension Maternal Grandmother   . Heart disease Maternal Grandmother   . Kidney disease Maternal Grandmother     Social History Social History   Tobacco Use  . Smoking status: Never Smoker  . Smokeless tobacco: Never Used  Substance Use Topics  . Alcohol use: Yes    Comment: social  . Drug use: No     Allergies   Shellfish allergy and Shrimp [shellfish allergy]   Review of Systems Review of Systems   Physical Exam Triage Vital Signs ED Triage Vitals  Enc Vitals Group     BP 02/03/18 1512 (!) 110/58     Pulse Rate 02/03/18 1512 (!) 59     Resp 02/03/18 1512 16     Temp 02/03/18 1512 98.3 F (36.8 C)     Temp Source 02/03/18 1512 Oral     SpO2 02/03/18 1512 98 %     Weight --      Height --      Head Circumference --      Peak Flow --      Pain Score 02/03/18 1513 0     Pain Loc --      Pain Edu? --      Excl. in GC? --    No data found.  Updated Vital Signs BP (!) 110/58 (BP Location: Left Arm)   Pulse (!) 59   Temp 98.3 F (36.8 C) (Oral)   Resp  16   SpO2 98%   Visual Acuity Right Eye Distance:   Left Eye Distance:   Bilateral Distance:    Right Eye Near:   Left Eye Near:    Bilateral Near:     Physical Exam  Skin:  approx 4-35mm subcutaneous skin lesion on right forearm; non-tender; mobile  Nursing note and vitals reviewed.    UC Treatments / Results  Labs (all labs ordered are listed, but only abnormal results are displayed) Labs Reviewed - No data to display  EKG  EKG Interpretation None       Radiology No results found.  Procedures Procedures (including critical care time)  Medications Ordered in UC Medications - No data to display   Initial Impression / Assessment and Plan / UC Course  I have reviewed the triage vital signs and the nursing notes.  Pertinent labs & imaging results that were available during my care of the patient were reviewed by me and considered in my  medical decision making (see chart for details).      Final Clinical Impressions(s) / UC Diagnoses   Final diagnoses:  Skin lesion  (on right forearm skin; likely sebaceous cyst)  ED Discharge Orders    None     1. diagnosis reviewed with patient 2. Recommend monitor and follow up with a dermatologist  Controlled Substance Prescriptions [place] Controlled Substance Registry consulted? Not Applicable   Payton Mccallumonty, Thompson Mckim, MD 02/03/18 [id number]

## 2018-02-03 NOTE — ED Triage Notes (Signed)
Patient noticed a deep knot is in his anterior forearm 2 days ago. Patient history of blood clots.

## 2018-02-23 ENCOUNTER — Other Ambulatory Visit: Payer: Self-pay

## 2018-02-23 ENCOUNTER — Ambulatory Visit
Admission: EM | Admit: 2018-02-23 | Discharge: 2018-02-23 | Disposition: A | Discharge status: Home / Self Care | Payer: BLUE CROSS/BLUE SHIELD | Attending: Family Medicine | Admitting: Family Medicine

## 2018-02-23 DIAGNOSIS — L739 Follicular disorder, unspecified: Secondary | ICD-10-CM

## 2018-02-23 MED ORDER — CEPHALEXIN 500 MG PO CAPS: 500.0000 mg | ORAL_CAPSULE | Freq: Two times a day (BID) | ORAL | 0 refills | Status: DC

## 2018-02-23 MED ORDER — MUPIROCIN 2 % EX OINT: 1.0000 "application " | TOPICAL_OINTMENT | Freq: Three times a day (TID) | CUTANEOUS | 0 refills | Status: DC

## 2018-02-23 NOTE — Discharge Instructions (Signed)
Apply warm compresses as we discussed 4 times daily leaving them on for 10 minutes at a time.  After drying apply the mupirocin ointment with the last application before bedtime.

## 2018-02-23 NOTE — ED Triage Notes (Signed)
Patient complains of 2 bumps to his right forearm that occurred about 3 days ago upon wakening. Patient states that areas have drained and are painful. Patient has a recent tattoo in this area (approx 462 weeks old). Patient thinks this may have been a insect of some kind.

## 2018-02-23 NOTE — ED Provider Notes (Addendum)
[facility]    CSN: [id number] Arrival date & time: 02/23/18  1530     History   Chief Complaint Chief Complaint  Patient presents with  . Abscess    HPI Terry Miles is a 27 y.o. male.   HPI  27 year old male presents with 2 tender areas on his right forearm.  .  Both are small pustule filled.  The dorsal lesion drained  this morning.  She had a tattoo performed about 2 weeks ago.  He states that the tattoo artist had shaved his forearm in preparation for application of the tattoo.  Review of his medical records shows 20 days ago he was in the clinic with another abscess on his volar forearm which he states has gone away.  He was diagnosed with a possible sebaceous cyst.  He denies any fever or chills. Areas are tense and tender.       Past Medical History:  Diagnosis Date  . Pulmonary emboli (HCC)     There are no active problems to display for this patient.   Past Surgical History:  Procedure Laterality Date  . NO PAST SURGERIES         Home Medications    Prior to Admission medications   Medication Sig Start Date End Date Taking? Authorizing Provider  rivaroxaban (XARELTO) 20 MG TABS tablet Take 20 mg by mouth daily with supper.   Yes [provider]  cephALEXin (KEFLEX) 500 MG capsule Take 1 capsule (500 mg total) by mouth 2 (two) times daily. 02/23/18   Lutricia Feil, PA-C  mupirocin ointment (BACTROBAN) 2 % Apply 1 application topically 3 (three) times daily. 02/23/18   Lutricia Feil, PA-C    Family History Family History  Problem Relation Age of Onset  . Hypertension Maternal Grandmother   . Heart disease Maternal Grandmother   . Kidney disease Maternal Grandmother     Social History Social History   Tobacco Use  . Smoking status: Never Smoker  . Smokeless tobacco: Never Used  Substance Use Topics  . Alcohol use: Yes    Comment: social  . Drug use: No     Allergies   Shellfish allergy and Shrimp [shellfish  allergy]   Review of Systems Review of Systems  Constitutional: Negative for activity change, appetite change, chills, fatigue and fever.  Skin: Positive for color change and rash.  All other systems reviewed and are negative.    Physical Exam Triage Vital Signs ED Triage Vitals  Enc Vitals Group     BP 02/23/18 1606 125/67     Pulse Rate 02/23/18 1606 72     Resp 02/23/18 1606 18     Temp 02/23/18 1606 98.2 F (36.8 C)     Temp Source 02/23/18 1606 Oral     SpO2 02/23/18 1606 98 %     Weight 02/23/18 1604 250 lb (113.4 kg)     Height 02/23/18 1604 6' (1.829 m)     Head Circumference --      Peak Flow --      Pain Score 02/23/18 1604 7     Pain Loc --      Pain Edu? --      Excl. in GC? --    No data found.  Updated Vital Signs BP 125/67 (BP Location: Left Arm)   Pulse 72   Temp 98.2 F (36.8 C) (Oral)   Resp 18   Ht 6' (1.829 m)   Wt 250 lb (  113.4 kg)   SpO2 98%   BMI 33.91 kg/m   Visual Acuity Right Eye Distance:   Left Eye Distance:   Bilateral Distance:    Right Eye Near:   Left Eye Near:    Bilateral Near:     Physical Exam  Constitutional: He is oriented to person, place, and time. He appears well-developed and well-nourished. No distress.  HENT:  Head: Normocephalic.  Eyes: Pupils are equal, round, and reactive to light. Right eye exhibits no discharge. Left eye exhibits no discharge.  Neck: Normal range of motion.  Musculoskeletal: Normal range of motion.  Neurological: He is alert and oriented to person, place, and time.  Skin: Skin is warm. He is not diaphoretic. There is erythema.  Examination of the right forearm shows a tattoo extending from the antecubital fossa to the wrist.  In the center of the tattoo is the another pustule is also present on the ulnar volar aspect mid forearm.  This has a pustule on the top that is not drained.  It is a surrounding induration no fluctuance.  The induration extends 1 cm in diameter.  Psychiatric: He has  a normal mood and affect. His behavior is normal. Judgment and thought content normal.  Nursing note and vitals reviewed.    UC Treatments / Results  Labs (all labs ordered are listed, but only abnormal results are displayed) Labs Reviewed - No data to display  EKG  EKG Interpretation None       Radiology No results found.  Procedures Procedures (including critical care time)  Medications Ordered in UC Medications - No data to display   Initial Impression / Assessment and Plan / UC Course  I have reviewed the triage vital signs and the nursing notes.  Pertinent labs & imaging results that were available during my care of the patient were reviewed by me and considered in my medical decision making (see chart for details).     Plan: 1. Test/x-ray results and diagnosis reviewed with patient 2. rx as per orders; risks, benefits, potential side effects reviewed with patient 3. Recommend supportive treatment with compresses 3-4 times daily fully explained to the patient.  After the application of warm compresses and drying he will apply mupirocin ointment to the areas.  I have also placed him on a short course of Keflex because of the folliculitis.  If it worsens or is not improving he should return to our clinic. 4. F/u prn if symptoms worsen or don't improve   Final Clinical Impressions(s) / UC Diagnoses   Final diagnoses:  Folliculitis    ED Discharge Orders        Ordered    mupirocin ointment (BACTROBAN) 2 %  3 times daily     02/23/18 1639    cephALEXin (KEFLEX) 500 MG capsule  2 times daily     02/23/18 1639       Controlled Substance Prescriptions [place] Controlled Substance Registry consulted? Not Applicable   Lutricia FeilRoemer, William P, PA-C 02/23/18 1719    Lutricia Feiloemer, William P, PA-C 02/23/18 1721

## 2018-03-05 DIAGNOSIS — Z86711 Personal history of pulmonary embolism: Secondary | ICD-10-CM | POA: Insufficient documentation

## 2018-04-23 ENCOUNTER — Ambulatory Visit
Admission: EM | Admit: 2018-04-23 | Discharge: 2018-04-23 | Disposition: A | Discharge status: Home / Self Care | Payer: BLUE CROSS/BLUE SHIELD | Attending: Family Medicine | Admitting: Family Medicine

## 2018-04-23 ENCOUNTER — Encounter: Payer: Self-pay | Admitting: *Deleted

## 2018-04-23 DIAGNOSIS — Z202 Contact with and (suspected) exposure to infections with a predominantly sexual mode of transmission: Secondary | ICD-10-CM

## 2018-04-23 DIAGNOSIS — Z113 Encounter for screening for infections with a predominantly sexual mode of transmission: Secondary | ICD-10-CM

## 2018-04-23 LAB — CHLAMYDIA/NGC RT PCR (ARMC ONLY)
CHLAMYDIA TR: NOT DETECTED
N gonorrhoeae: NOT DETECTED

## 2018-04-23 NOTE — ED Provider Notes (Signed)
[facility]    CSN: [id number] Arrival date & time: 04/23/18  1431  History   Chief Complaint Chief Complaint  Patient presents with  . Exposure to STD   HPI  27 year old male presents for STD testing.  Patient states that approximately 3 weeks ago he had unprotected oral sex.  He states that he was told by a friend that his partner had had recent chlamydia.  He states that he is currently asymptomatic.  He has no penile discharge or urinary symptoms.  He is feeling well.  Patient states that he wants STD testing today.  Patient declines to have blood work done as he states that he receives this regularly.  No other reported symptoms.  No other complaints.  Past Medical History:  Diagnosis Date  . Pulmonary emboli [facility](HCC)    Past Surgical History:  Procedure Laterality Date  . NO PAST SURGERIES     Home Medications    Prior to Admission medications   Medication Sig Start Date End Date Taking? Authorizing Provider  rivaroxaban (XARELTO) 20 MG TABS tablet Take 20 mg by mouth daily with supper.   Yes [provider]  cephALEXin (KEFLEX) 500 MG capsule Take 1 capsule (500 mg total) by mouth 2 (two) times daily. 02/23/18   Lutricia Feiloemer, William P, PA-C  mupirocin ointment (BACTROBAN) 2 % Apply 1 application topically 3 (three) times daily. 02/23/18   Lutricia Feiloemer, William P, PA-C    Family History Family History  Problem Relation Age of Onset  . Hypertension Maternal Grandmother   . Heart disease Maternal Grandmother   . Kidney disease Maternal Grandmother     Social History Social History   Tobacco Use  . Smoking status: Never Smoker  . Smokeless tobacco: Never Used  Substance Use Topics  . Alcohol use: Yes    Comment: social  . Drug use: No     Allergies   Shellfish allergy and Shrimp [shellfish allergy]   Review of Systems Review of Systems  Constitutional: Negative.   Genitourinary: Negative.    Physical Exam Triage Vital Signs ED Triage Vitals    Enc Vitals Group     BP 04/23/18 1448 (!) 107/55     Pulse Rate 04/23/18 1448 69     Resp 04/23/18 1448 18     Temp 04/23/18 1448 98.2 F (36.8 C)     Temp Source 04/23/18 1448 Oral     SpO2 04/23/18 1448 98 %     Weight 04/23/18 1446 250 lb (113.4 kg)     Height 04/23/18 1446 6' (1.829 m)     Head Circumference --      Peak Flow --      Pain Score 04/23/18 1508 0     Pain Loc --      Pain Edu? --      Excl. in GC? --    Updated Vital Signs BP (!) 107/55 (BP Location: Left Arm)   Pulse 69   Temp 98.2 F (36.8 C) (Oral)   Resp 18   Ht 6' (1.829 m)   Wt 250 lb (113.4 kg)   SpO2 98%   BMI 33.91 kg/m     Physical Exam  Constitutional: He is oriented to person, place, and time. He appears well-developed. No distress.  HENT:  Head: Normocephalic and atraumatic.  Cardiovascular: Normal rate and regular rhythm.  Pulmonary/Chest: Effort normal and breath sounds normal. He has no wheezes. He has no rales.  Neurological: He is alert and oriented  to person, place, and time.  Psychiatric: He has a normal mood and affect. His behavior is normal.  Nursing note and vitals reviewed.  UC Treatments / Results  Labs (all labs ordered are listed, but only abnormal results are displayed) Labs Reviewed  CHLAMYDIA/NGC RT PCR [facility] ONLY)    EKG None Radiology No results found.  Procedures Procedures (including critical care time)  Medications Ordered in UC Medications - No data to display   Initial Impression / Assessment and Plan / UC Course  I have reviewed the triage vital signs and the nursing notes.  Pertinent labs & imaging results that were available during my care of the patient were reviewed by me and considered in my medical decision making (see chart for details).    27 year old male presents for STD testing.  Urine testing done today.  Patient declined blood work.  Awaiting test results prior to treatment.  Final Clinical Impressions(s) / UC Diagnoses    Final diagnoses:  STD exposure    ED Discharge Orders    None     Controlled Substance Prescriptions [place] Controlled Substance Registry consulted? Not Applicable   Tommie Sams, DO 04/23/18 1557

## 2018-04-23 NOTE — ED Triage Notes (Signed)
Pt would like to be check for STDs since he was sexually active 3 weeks ago with a person that possible has chlamydia and gonorrhea . Pt is not having any symptoms at this moment.

## 2018-04-23 NOTE — Discharge Instructions (Signed)
We will call with the results.  Take care  Dr. Purnell Daigle  

## 2018-04-24 NOTE — Progress Notes (Signed)
Results are within normal range. Pt contacted and made aware. Verbalized understanding.   

## 2018-12-13 DIAGNOSIS — F430 Acute stress reaction: Secondary | ICD-10-CM | POA: Insufficient documentation

## 2018-12-13 DIAGNOSIS — F419 Anxiety disorder, unspecified: Secondary | ICD-10-CM | POA: Insufficient documentation

## 2019-01-17 DIAGNOSIS — K760 Fatty (change of) liver, not elsewhere classified: Secondary | ICD-10-CM | POA: Insufficient documentation

## 2019-01-17 DIAGNOSIS — R918 Other nonspecific abnormal finding of lung field: Secondary | ICD-10-CM | POA: Insufficient documentation

## 2019-02-20 ENCOUNTER — Other Ambulatory Visit: Payer: Self-pay

## 2019-02-20 ENCOUNTER — Ambulatory Visit
Admission: EM | Admit: 2019-02-20 | Discharge: 2019-02-20 | Disposition: A | Discharge status: Home / Self Care | Payer: BLUE CROSS/BLUE SHIELD | Attending: Family Medicine | Admitting: Family Medicine

## 2019-02-20 DIAGNOSIS — N4889 Other specified disorders of penis: Secondary | ICD-10-CM

## 2019-02-20 LAB — URINALYSIS, COMPLETE (UACMP) WITH MICROSCOPIC
Bacteria, UA: NONE SEEN
Bilirubin Urine: NEGATIVE
Glucose, UA: NEGATIVE mg/dL
HGB URINE DIPSTICK: NEGATIVE
Ketones, ur: NEGATIVE mg/dL
LEUKOCYTE UA: NEGATIVE
NITRITE: NEGATIVE
PH: 5.5 (ref 5.0–8.0)
Protein, ur: NEGATIVE mg/dL
Specific Gravity, Urine: 1.025 (ref 1.005–1.030)
Squamous Epithelial / LPF: NONE SEEN (ref 0–5)

## 2019-02-20 NOTE — ED Triage Notes (Signed)
States he had dysuria. Had STD testing which came back negative. Bought an OTC UTI test and stated the test said he had a UTI. Also would like to be tested for HSV 1&2

## 2019-02-20 NOTE — ED Provider Notes (Signed)
[facility]    CSN: [id number] Arrival date & time: 02/20/19  1302  History   Chief Complaint Chief Complaint  Patient presents with  . Dysuria   HPI  28 year old male presents with penile pain.  Patient reports ongoing penile pain. Denies dysuria.  Patient states that he has had STD testing recently which was negative.  Patient has had unprotected intercourse.  Patient reports that he took an over-the-counter UTI test and it was positive.  Patient also concerned about possibility of genital herpes as he had a sexual partner which tested positive for HSV 1 (unsure whether this was a blood test or a genital swab).  No known exacerbating factors.  No other reported symptoms.  No other complaints.  PMH, Surgical Hx, Family Hx, Social History reviewed and updated as below.  Past Medical History:  Diagnosis Date  . Pulmonary emboli [facility])    Past Surgical History:  Procedure Laterality Date  . NO PAST SURGERIES     Home Medications    Prior to Admission medications   Medication Sig Start Date End Date Taking? Authorizing Provider  rivaroxaban (XARELTO) 20 MG TABS tablet Take 20 mg by mouth daily with supper.   Yes [provider]  mupirocin ointment (BACTROBAN) 2 % Apply 1 application topically 3 (three) times daily. 02/23/18   Lutricia Feil, PA-C   Family History Family History  Problem Relation Age of Onset  . Hypertension Maternal Grandmother   . Heart disease Maternal Grandmother   . Kidney disease Maternal Grandmother    Social History Social History   Tobacco Use  . Smoking status: Never Smoker  . Smokeless tobacco: Never Used  Substance Use Topics  . Alcohol use: Yes    Comment: social  . Drug use: No   Allergies   Shellfish allergy and Shrimp [shellfish allergy]   Review of Systems Review of Systems  Constitutional: Negative.   Genitourinary:       Penile pain.    Physical Exam Triage Vital Signs ED Triage Vitals [02/20/19  1349]  Enc Vitals Group     BP (!) 143/72     Pulse Rate 72     Resp      Temp 98.2 F (36.8 C)     Temp src      SpO2 100 %     Weight 270 lb (122.5 kg)     Height 5\' 11"  (1.803 m)     Head Circumference      Peak Flow      Pain Score 0     Pain Loc      Pain Edu?      Excl. in GC?    Updated Vital Signs BP (!) 143/72   Pulse 72   Temp 98.2 F (36.8 C)   Ht 5\' 11"  (1.803 m)   Wt 122.5 kg   SpO2 100%   BMI 37.66 kg/m   Visual Acuity Right Eye Distance:   Left Eye Distance:   Bilateral Distance:    Right Eye Near:   Left Eye Near:    Bilateral Near:     Physical Exam Vitals signs and nursing note reviewed.  Constitutional:      General: He is not in acute distress.    Appearance: Normal appearance.  HENT:     Head: Normocephalic and atraumatic.     Nose: Nose normal.  Eyes:     General:        Right eye:  No discharge.        Left eye: No discharge.     Conjunctiva/sclera: Conjunctivae normal.  Pulmonary:     Effort: Pulmonary effort is normal. No respiratory distress.  Genitourinary:    Penis: Normal and uncircumcised. No tenderness or discharge.      Scrotum/Testes:        Right: Mass or tenderness not present.        Left: Mass or tenderness not present.  Neurological:     Mental Status: He is alert.  Psychiatric:        Mood and Affect: Mood normal.        Behavior: Behavior normal.    UC Treatments / Results  Labs (all labs ordered are listed, but only abnormal results are displayed) Labs Reviewed  URINALYSIS, COMPLETE (UACMP) WITH MICROSCOPIC    EKG None  Radiology No results found.  Procedures Procedures (including critical care time)  Medications Ordered in UC Medications - No data to display  Initial Impression / Assessment and Plan / UC Course  I have reviewed the triage vital signs and the nursing notes.  Pertinent labs & imaging results that were available during my care of the patient were reviewed by me and considered  in my medical decision making (see chart for details).    28 year old male presents with penile pain.  No evidence of UTI.  Awaiting STD results.  Patient elected not to proceed with HSV blood testing after discussion.  Supportive care.  Final Clinical Impressions(s) / UC Diagnoses   Final diagnoses:  Penile pain   Discharge Instructions   None    ED Prescriptions    None     Controlled Substance Prescriptions [place] Controlled Substance Registry consulted? Not Applicable   Tommie Sams, DO 02/20/19 1601

## 2019-08-23 ENCOUNTER — Ambulatory Visit
Admission: EM | Admit: 2019-08-23 | Discharge: 2019-08-23 | Disposition: A | Discharge status: Home / Self Care | Payer: BC Managed Care – PPO | Attending: Family Medicine | Admitting: Family Medicine

## 2019-08-23 ENCOUNTER — Other Ambulatory Visit: Payer: Self-pay

## 2019-08-23 DIAGNOSIS — Z202 Contact with and (suspected) exposure to infections with a predominantly sexual mode of transmission: Secondary | ICD-10-CM | POA: Diagnosis not present

## 2019-08-23 MED ORDER — AZITHROMYCIN 500 MG PO TABS
1000.0000 mg | ORAL_TABLET | Freq: Once | ORAL | Status: AC
  Administered 2019-08-23: 18:00:00 1000 mg via ORAL

## 2019-08-23 NOTE — ED Provider Notes (Signed)
[facility]    CSN: [id number] Arrival date & time: 08/23/19  1651  History   Chief Complaint Chief Complaint  Patient presents with  . Exposure to STD     HPI  28 year old male presents with the above complaint.  Patient reports that he was notified today that his recent sexual partner tested positive for chlamydia.  Patient states that he is currently feeling well.  Denies penile discharge.  Denies dysuria.  Patient states that he wants to get tested and also wants to be treated empirically given unprotected intercourse with his partner twice.  No other reported symptoms.  No other complaints or concerns at this time.   PMH, Surgical Hx, Family Hx, Social History reviewed and updated as below.  Past Medical History:  Diagnosis Date  . Pulmonary emboli (HCC)   Fatty liver Anxiety Obesity Migraine  Past Surgical History:  Procedure Laterality Date  . NO PAST SURGERIES      Home Medications    Prior to Admission medications   Medication Sig Start Date End Date Taking? Authorizing Provider  rivaroxaban (XARELTO) 20 MG TABS tablet Take 20 mg by mouth daily with supper.   Yes [provider]  mupirocin ointment (BACTROBAN) 2 % Apply 1 application topically 3 (three) times daily. 02/23/18   Lutricia Feiloemer, William P, PA-C    Family History Family History  Problem Relation Age of Onset  . Hypertension Maternal Grandmother   . Heart disease Maternal Grandmother   . Kidney disease Maternal Grandmother     Social History Social History   Tobacco Use  . Smoking status: Never Smoker  . Smokeless tobacco: Never Used  Substance Use Topics  . Alcohol use: Yes    Comment: social  . Drug use: No     Allergies   Shellfish allergy and Shrimp [shellfish allergy]   Review of Systems Review of Systems  Constitutional: Negative.   Genitourinary: Negative.    Physical Exam Triage Vital Signs ED Triage Vitals  Enc Vitals Group     BP 08/23/19 1713  135/63     Pulse Rate 08/23/19 1713 67     Resp 08/23/19 1713 16     Temp 08/23/19 1713 98.2 F (36.8 C)     Temp Source 08/23/19 1713 Oral     SpO2 08/23/19 1713 99 %     Weight 08/23/19 1709 265 lb (120.2 kg)     Height 08/23/19 1709 6' (1.829 m)     Head Circumference --      Peak Flow --      Pain Score 08/23/19 1709 0     Pain Loc --      Pain Edu? --      Excl. in GC? --    Updated Vital Signs BP 135/63 (BP Location: Left Arm)   Pulse 67   Temp 98.2 F (36.8 C) (Oral)   Resp 16   Ht 6' (1.829 m)   Wt 120.2 kg   SpO2 99%   BMI 35.94 kg/m   Visual Acuity Right Eye Distance:   Left Eye Distance:   Bilateral Distance:    Right Eye Near:   Left Eye Near:    Bilateral Near:     Physical Exam Vitals signs and nursing note reviewed.  Constitutional:      General: He is not in acute distress.    Appearance: Normal appearance. He is obese.  HENT:     Head: Normocephalic and atraumatic.  Eyes:  General:        Right eye: No discharge.        Left eye: No discharge.     Conjunctiva/sclera: Conjunctivae normal.  Cardiovascular:     Rate and Rhythm: Normal rate and regular rhythm.     Heart sounds: No murmur.  Pulmonary:     Effort: Pulmonary effort is normal.     Breath sounds: Normal breath sounds. No wheezing, rhonchi or rales.  Abdominal:     General: There is no distension.     Palpations: Abdomen is soft.     Tenderness: There is no abdominal tenderness.  Neurological:     Mental Status: He is alert.  Psychiatric:        Mood and Affect: Mood normal.        Behavior: Behavior normal.    UC Treatments / Results  Labs (all labs ordered are listed, but only abnormal results are displayed) Labs Reviewed  GC/CHLAMYDIA PROBE AMP    EKG   Radiology No results found.  Procedures Procedures (including critical care time)  Medications Ordered in UC Medications  azithromycin (ZITHROMAX) tablet 1,000 mg (1,000 mg Oral Given 08/23/19 1740)     Initial Impression / Assessment and Plan / UC Course  I have reviewed the triage vital signs and the nursing notes.  Pertinent labs & imaging results that were available during my care of the patient were reviewed by me and considered in my medical decision making (see chart for details).    28 year old male presents with exposure to STD.  Patient desires empiric treatment today.  Azithromycin given today.  Awaiting results.  Final Clinical Impressions(s) / UC Diagnoses   Final diagnoses:  Exposure to STD     Discharge Instructions     You will receive a call if results are positive.  Take care  Dr. Lacinda Axon    ED Prescriptions    None     Controlled Substance Prescriptions [place] Controlled Substance Registry consulted? Not Applicable   Coral Spikes, DO 08/23/19 1829

## 2019-08-23 NOTE — Discharge Instructions (Signed)
You will receive a call if results are positive.  Take care  Dr. Lacinda Axon

## 2019-08-23 NOTE — ED Triage Notes (Signed)
Patient states that he was at work today a previous partner called and told him they had chlamydia. Patient states that he would like to be treated as well. Patient currently with no symptoms.

## 2019-08-25 LAB — GC/CHLAMYDIA PROBE AMP
Chlamydia trachomatis, NAA: POSITIVE — AB
Neisseria Gonorrhoeae by PCR: NEGATIVE

## 2019-08-26 ENCOUNTER — Telehealth (HOSPITAL_COMMUNITY): Payer: Self-pay | Admitting: Emergency Medicine

## 2019-08-26 NOTE — Telephone Encounter (Signed)
Chlamydia is positive.  This was treated at the urgent care visit with po zithromax 1g.  Pt needs education to please refrain from sexual intercourse for 7 days to give the medicine time to work.  Sexual partners need to be notified and tested/treated.  Condoms may reduce risk of reinfection.  Recheck or followup with PCP for further evaluation if symptoms are not improving.  [facility] notified.  Patient contacted and made aware of    results, all questions answered   

## 2019-09-09 DIAGNOSIS — J4520 Mild intermittent asthma, uncomplicated: Secondary | ICD-10-CM | POA: Insufficient documentation

## 2020-05-07 DIAGNOSIS — G5601 Carpal tunnel syndrome, right upper limb: Secondary | ICD-10-CM | POA: Insufficient documentation

## 2020-08-14 ENCOUNTER — Other Ambulatory Visit: Payer: Self-pay

## 2020-08-14 ENCOUNTER — Ambulatory Visit
Admission: EM | Admit: 2020-08-14 | Discharge: 2020-08-14 | Disposition: A | Discharge status: Home / Self Care | Payer: Medicaid Other | Attending: Family Medicine | Admitting: Family Medicine

## 2020-08-14 ENCOUNTER — Encounter: Payer: Self-pay | Admitting: Emergency Medicine

## 2020-08-14 DIAGNOSIS — M7918 Myalgia, other site: Secondary | ICD-10-CM | POA: Diagnosis not present

## 2020-08-14 MED ORDER — TRAMADOL HCL 50 MG PO TABS: 50.0000 mg | ORAL_TABLET | Freq: Three times a day (TID) | ORAL | 0 refills | Status: DC | PRN

## 2020-08-14 NOTE — ED Triage Notes (Signed)
Pt c/o pain in his upper back and chest. Started yesterday morning. He states the pain is reproduced by movement of his arm or taking a deep breath.  Denies shortness of breath. Pt states he had a cough the night before the pain started but only lasted through the night. He states he was around someone smoking and that is usually what happens. Pt has h/o PE and is taking Xarelto.

## 2020-08-14 NOTE — Discharge Instructions (Signed)
Medication as prescribed.  Take your Xarelto.  Take care  Dr. Adriana Simas

## 2020-08-14 NOTE — ED Provider Notes (Signed)
[facility]    CSN: [id number] Arrival date & time: 08/14/20  1403   History   Chief Complaint Chief Complaint  Patient presents with  . Back Pain  . Chest Pain   HPI  29 year old male presents with the above complaints.  Patient reports that his symptoms started yesterday.  He reports anterior chest pain, back pain, neck pain.  He states that his pain seems to be reproduced by movement/activity.  No relieving factors.  Denies shortness of breath.  Patient feels that this is musculoskeletal.  Patient reports that he lifts and moves a lot at work.  Patient has a history of PE.  He is currently on Xarelto and has intermittent compliance.  Patient states that he misses approximately 3 doses a week.  No medications or interventions tried.  No other complaints.  Past Medical History:  Diagnosis Date  . Pulmonary emboli [facility])    Past Surgical History:  Procedure Laterality Date  . NO PAST SURGERIES     Home Medications    Prior to Admission medications   Medication Sig Start Date End Date Taking? Authorizing Provider  rivaroxaban (XARELTO) 20 MG TABS tablet Take 20 mg by mouth daily with supper.   Yes [provider]  traMADol (ULTRAM) 50 MG tablet Take 1 tablet (50 mg total) by mouth every 8 (eight) hours as needed for moderate pain. 08/14/20   Tommie Sams, DO   Family History Family History  Problem Relation Age of Onset  . Hypertension Maternal Grandmother   . Heart disease Maternal Grandmother   . Kidney disease Maternal Grandmother    Social History Social History   Tobacco Use  . Smoking status: Never Smoker  . Smokeless tobacco: Never Used  Vaping Use  . Vaping Use: Never used  Substance Use Topics  . Alcohol use: Yes    Comment: social  . Drug use: No   Allergies   Shellfish allergy and Shrimp [shellfish allergy]  Review of Systems Review of Systems  Cardiovascular: Positive for chest pain.  Musculoskeletal: Positive for back pain  and neck pain.   Physical Exam Triage Vital Signs ED Triage Vitals  Enc Vitals Group     BP 08/14/20 1420 (!) 124/53     Pulse Rate 08/14/20 1420 67     Resp 08/14/20 1420 18     Temp 08/14/20 1420 98.3 F (36.8 C)     Temp Source 08/14/20 1420 Oral     SpO2 08/14/20 1420 98 %     Weight 08/14/20 1414 264 lb 15.9 oz (120.2 kg)     Height 08/14/20 1414 6' (1.829 m)     Head Circumference --      Peak Flow --      Pain Score 08/14/20 1414 7     Pain Loc --      Pain Edu? --      Excl. in GC? --    Updated Vital Signs BP (!) 124/53 (BP Location: Right Arm)   Pulse 67   Temp 98.3 F (36.8 C) (Oral)   Resp 18   Ht 6' (1.829 m)   Wt 120.2 kg   SpO2 98%   BMI 35.94 kg/m   Visual Acuity Right Eye Distance:   Left Eye Distance:   Bilateral Distance:    Right Eye Near:   Left Eye Near:    Bilateral Near:     Physical Exam Vitals reviewed.  Constitutional:      General: He  is not in acute distress.    Appearance: Normal appearance. He is obese. He is not ill-appearing.  HENT:     Head: Normocephalic and atraumatic.  Eyes:     General:        Right eye: No discharge.        Left eye: No discharge.     Conjunctiva/sclera: Conjunctivae normal.  Cardiovascular:     Rate and Rhythm: Normal rate and regular rhythm.  Pulmonary:     Effort: Pulmonary effort is normal.     Breath sounds: Normal breath sounds. No wheezing, rhonchi or rales.  Chest:     Chest wall: No tenderness.  Neurological:     Mental Status: He is alert.  Psychiatric:        Mood and Affect: Mood normal.        Behavior: Behavior normal.    UC Treatments / Results  Labs (all labs ordered are listed, but only abnormal results are displayed) Labs Reviewed - No data to display  EKG Interpretation: Sinus bradycardia.  Sinus arrhythmia.  Normal axis.  No ST or T wave changes.  Radiology No results found.  Procedures Procedures (including critical care time)  Medications Ordered in  UC Medications - No data to display  Initial Impression / Assessment and Plan / UC Course  I have reviewed the triage vital signs and the nursing notes.  Pertinent labs & imaging results that were available during my care of the patient were reviewed by me and considered in my medical decision making (see chart for details).    29 year old male presents with musculoskeletal pain.  Tramadol as needed.  Advised him to be compliant with Xarelto.  Final Clinical Impressions(s) / UC Diagnoses   Final diagnoses:  Musculoskeletal pain     Discharge Instructions     Medication as prescribed.  Take your Xarelto.  Take care  Dr. Adriana Simas    ED Prescriptions    Medication Sig Dispense Auth. Provider   traMADol (ULTRAM) 50 MG tablet Take 1 tablet (50 mg total) by mouth every 8 (eight) hours as needed for moderate pain. 10 tablet Everlene Other G, DO     I have reviewed the PDMP during this encounter.   Tommie Sams, DO 08/14/20 1500

## 2021-01-04 ENCOUNTER — Ambulatory Visit: Admit: 2021-01-04 | Disposition: A | Discharge status: Home / Self Care | Payer: Medicaid Other

## 2021-01-05 ENCOUNTER — Other Ambulatory Visit: Payer: Medicaid Other

## 2021-01-25 ENCOUNTER — Other Ambulatory Visit: Payer: Self-pay

## 2021-01-25 ENCOUNTER — Ambulatory Visit
Admission: EM | Admit: 2021-01-25 | Discharge: 2021-01-25 | Disposition: A | Discharge status: Home / Self Care | Payer: Medicaid Other | Attending: Family Medicine | Admitting: Family Medicine

## 2021-01-25 DIAGNOSIS — R197 Diarrhea, unspecified: Secondary | ICD-10-CM | POA: Insufficient documentation

## 2021-01-25 DIAGNOSIS — Z8249 Family history of ischemic heart disease and other diseases of the circulatory system: Secondary | ICD-10-CM | POA: Insufficient documentation

## 2021-01-25 DIAGNOSIS — Z86711 Personal history of pulmonary embolism: Secondary | ICD-10-CM | POA: Diagnosis not present

## 2021-01-25 DIAGNOSIS — R519 Headache, unspecified: Secondary | ICD-10-CM | POA: Insufficient documentation

## 2021-01-25 DIAGNOSIS — Z91011 Allergy to milk products: Secondary | ICD-10-CM | POA: Insufficient documentation

## 2021-01-25 DIAGNOSIS — Z91013 Allergy to seafood: Secondary | ICD-10-CM | POA: Insufficient documentation

## 2021-01-25 DIAGNOSIS — Z20822 Contact with and (suspected) exposure to covid-19: Secondary | ICD-10-CM | POA: Insufficient documentation

## 2021-01-25 DIAGNOSIS — Z7901 Long term (current) use of anticoagulants: Secondary | ICD-10-CM | POA: Insufficient documentation

## 2021-01-25 HISTORY — DX: Obesity, unspecified: E66.9

## 2021-01-25 HISTORY — DX: Unspecified asthma, uncomplicated: J45.909

## 2021-01-25 HISTORY — DX: Lactose intolerance, unspecified: E73.9

## 2021-01-25 NOTE — Discharge Instructions (Addendum)
Isolate at home until the results of your Covid test are back.  If your test is positive then you will have to quarantine for 5 days from when your symptoms started.  After the 5 days you can break quarantine if your symptoms have improved and you have not run a fever for 24 hours.  Increase your intake of dietary fiber to add bulk to her stool and help cut down on the number of diarrhea stools you are having.  Continue to rehydrate using Pedialyte, Gatorade, water, and broth.  Return for reevaluation or go to the ER for any new or worsening symptoms.

## 2021-01-25 NOTE — ED Triage Notes (Addendum)
Pt is here with a headache and diarrhea that started 2 days ago, pt has not taken any meds to relieve discomfort. Pt's girlfriend tested POSITIVE for COVID last week and the last contact with her was yesterday.

## 2021-01-25 NOTE — ED Provider Notes (Signed)
[facility]    CSN: [id number] Arrival date & time: 01/25/21  1211      History   Chief Complaint Chief Complaint  Patient presents with  . Headache  . Diarrhea    HPI Terry Miles is a 30 y.o. male.   HPI   30 year old male here for evaluation of headache and diarrhea.  Patient reports that he has been experiencing diarrhea stools for the last 2 days.  Patient reports that he will have 3-4 stools in the morning and then will taper off throughout the day.  Patient states that the headache has been going on for little bit longer but he cannot really say how much longer he does note that it preceded the diarrhea.  Patient states that he has had some night sweats, associated nausea, body aches, and has had a COVID exposure.  Patient is on vaccine against COVID.  Patient denies fever, runny nose, ear pain or pressure, sore throat, shortness of breath or wheezing, vomiting, or cough.  Past Medical History:  Diagnosis Date  . Asthma   . Lactose intolerance   . Obesity   . Pulmonary emboli (HCC)     There are no problems to display for this patient.   Past Surgical History:  Procedure Laterality Date  . NO PAST SURGERIES         Home Medications    Prior to Admission medications   Medication Sig Start Date End Date Taking? Authorizing Provider  rivaroxaban (XARELTO) 20 MG TABS tablet Take by mouth. 05/15/20  Yes [provider]  rivaroxaban (XARELTO) 20 MG TABS tablet Take 20 mg by mouth daily with supper.    [provider]  traMADol (ULTRAM) 50 MG tablet Take 1 tablet (50 mg total) by mouth every 8 (eight) hours as needed for moderate pain. 08/14/20   Tommie Sams, DO    Family History Family History  Problem Relation Age of Onset  . Pulmonary embolism Mother   . Asthma Mother   . Hypertension Maternal Grandmother   . Heart disease Maternal Grandmother   . Kidney disease Maternal Grandmother     Social History Social History    Tobacco Use  . Smoking status: Never Smoker  . Smokeless tobacco: Never Used  Vaping Use  . Vaping Use: Never used  Substance Use Topics  . Alcohol use: Yes  . Drug use: Yes    Types: Marijuana     Allergies   Shellfish allergy, Shellfish-derived products, and Shellfish allergy   Review of Systems Review of Systems  Constitutional: Positive for diaphoresis. Negative for activity change, appetite change and fever.  HENT: Negative for rhinorrhea and sore throat.   Respiratory: Negative for cough, shortness of breath and wheezing.   Gastrointestinal: Positive for diarrhea and nausea. Negative for abdominal pain and vomiting.  Musculoskeletal: Positive for arthralgias and myalgias.  Skin: Negative for rash.  Neurological: Positive for headaches.  Hematological: Negative.   Psychiatric/Behavioral: Negative.      Physical Exam Triage Vital Signs ED Triage Vitals  Enc Vitals Group     BP 01/25/21 1235 126/88     Pulse Rate 01/25/21 1235 80     Resp 01/25/21 1235 19     Temp 01/25/21 1235 98.9 F (37.2 C)     Temp Source 01/25/21 1235 Oral     SpO2 01/25/21 1235 100 %     Weight --      Height --      Head  Circumference --      Peak Flow --      Pain Score 01/25/21 1233 0     Pain Loc --      Pain Edu? --      Excl. in GC? --    No data found.  Updated Vital Signs BP 126/88 (BP Location: Left Arm)   Pulse 80   Temp 98.9 F (37.2 C) (Oral)   Resp 19   SpO2 100%   Visual Acuity Right Eye Distance:   Left Eye Distance:   Bilateral Distance:    Right Eye Near:   Left Eye Near:    Bilateral Near:     Physical Exam Vitals and nursing note reviewed.  Constitutional:      General: He is not in acute distress.    Appearance: He is well-developed. He is obese. He is not ill-appearing.  HENT:     Head: Normocephalic and atraumatic.     Mouth/Throat:     Mouth: Mucous membranes are moist.     Pharynx: Oropharynx is clear.  Cardiovascular:     Rate and  Rhythm: Normal rate and regular rhythm.     Heart sounds: Normal heart sounds. No murmur heard. No gallop.   Pulmonary:     Effort: Pulmonary effort is normal.     Breath sounds: Normal breath sounds. No wheezing, rhonchi or rales.  Skin:    General: Skin is warm and dry.     Capillary Refill: Capillary refill takes less than 2 seconds.     Findings: No erythema or rash.  Neurological:     Mental Status: He is alert and oriented to person, place, and time.     GCS: GCS eye subscore is 4. GCS verbal subscore is 5. GCS motor subscore is 6.  Psychiatric:        Mood and Affect: Mood normal.        Speech: Speech normal.        Behavior: Behavior normal.      UC Treatments / Results  Labs (all labs ordered are listed, but only abnormal results are displayed) Labs Reviewed  SARS CORONAVIRUS 2 (TAT 6-24 HRS)    EKG   Radiology No results found.  Procedures Procedures (including critical care time)  Medications Ordered in UC Medications - No data to display  Initial Impression / Assessment and Plan / UC Course  I have reviewed the triage vital signs and the nursing notes.  Pertinent labs & imaging results that were available during my care of the patient were reviewed by me and considered in my medical decision making (see chart for details).   Presents for evaluation of headache and diarrhea that have been going on for the past several days.  Patient is not in any acute distress.  Patient symptoms are consistent with possible Covid infection and he has had a COVID exposure.  He himself is not vaccinated against COVID.  Patient states that his diarrhea is mainly 3-4 stools in the morning but then is better throughout the day.  He reports that he had gone to the ER 1 day to be evaluated because he had some blood when he wiped but that is resolved.  Patient denies any red in the toilet water, dizziness, or fainting.  We will discharge patient home to isolate pending the results  of his Covid test.  Patient advised to increase his dietary fiber intake to add bulk to his stool and not to take Imodium.  Final Clinical Impressions(s) / UC Diagnoses   Final diagnoses:  Diarrhea, unspecified type  Acute nonintractable headache, unspecified headache type     Discharge Instructions     Isolate at home until the results of your Covid test are back.  If your test is positive then you will have to quarantine for 5 days from when your symptoms started.  After the 5 days you can break quarantine if your symptoms have improved and you have not run a fever for 24 hours.  Increase your intake of dietary fiber to add bulk to her stool and help cut down on the number of diarrhea stools you are having.  Continue to rehydrate using Pedialyte, Gatorade, water, and broth.  Return for reevaluation or go to the ER for any new or worsening symptoms.    ED Prescriptions    None     PDMP not reviewed this encounter.   Becky Augusta, NP 01/25/21 1316

## 2021-01-26 LAB — SARS CORONAVIRUS 2 (TAT 6-24 HRS): SARS Coronavirus 2: NEGATIVE

## 2021-02-09 ENCOUNTER — Ambulatory Visit
Admission: EM | Admit: 2021-02-09 | Discharge: 2021-02-09 | Disposition: A | Discharge status: Home / Self Care | Payer: Medicaid Other

## 2021-02-09 ENCOUNTER — Other Ambulatory Visit: Payer: Self-pay

## 2021-02-09 DIAGNOSIS — F10230 Alcohol dependence with withdrawal, uncomplicated: Secondary | ICD-10-CM

## 2021-02-09 DIAGNOSIS — F10930 Alcohol use, unspecified with withdrawal, uncomplicated: Secondary | ICD-10-CM

## 2021-02-09 MED ORDER — GABAPENTIN 300 MG PO CAPS: 300.0000 mg | ORAL_CAPSULE | Freq: Every day | ORAL | 1 refills | Status: DC

## 2021-02-09 MED ORDER — ONDANSETRON HCL 4 MG PO TABS: 4.0000 mg | ORAL_TABLET | Freq: Three times a day (TID) | ORAL | 1 refills | Status: DC | PRN

## 2021-02-09 NOTE — Discharge Instructions (Signed)
You were seen for panic attacks and are being treated for alcohol withdrawal.   Start taking your new medication, gabapentin, as prescribed.  It should help with your panic attacks, anxiety, and headaches.  You can use the Zofran as needed for nausea.  Have a conversation with your mother regarding outpatient management of your alcohol dependence.  Resources include [facility] and [facility] outpatient psychiatry.   Take care, Dr. Sharlet Salina, NP-c

## 2021-02-09 NOTE — ED Provider Notes (Incomplete)
[facility] [place] [facility] [place]   Name: Terry Miles DOB: 16-Feb-1991 MRN: [id number] CSN: [id number] PCP: Jenell Milliner, MD  Arrival date and time:  02/09/21 0918  Chief Complaint:  Panic Attack   NOTE: Prior to seeing the patient today, I have reviewed the triage nursing documentation and vital signs. Clinical staff has updated patient's PMH/PSHx, current medication list, and drug allergies/intolerances to ensure comprehensive history available to assist in medical decision making.   History:   HPI: Terry Miles is a 30 y.o. male who presents today with complaints of panic attacks.     Past Medical History:  Diagnosis Date  . Asthma   . Lactose intolerance   . Obesity   . Pulmonary emboli [facility])     Past Surgical History:  Procedure Laterality Date  . NO PAST SURGERIES      Family History  Problem Relation Age of Onset  . Pulmonary embolism Mother   . Asthma Mother   . Hypertension Maternal Grandmother   . Heart disease Maternal Grandmother   . Kidney disease Maternal Grandmother     Social History   Tobacco Use  . Smoking status: Never Smoker  . Smokeless tobacco: Never Used  Vaping Use  . Vaping Use: Never used  Substance Use Topics  . Alcohol use: Yes  . Drug use: Yes    Types: Marijuana    There are no problems to display for this patient.   Home Medications:    Current Meds  Medication Sig  . gabapentin (NEURONTIN) 300 MG capsule Take 1 capsule (300 mg total) by mouth daily.  . ondansetron (ZOFRAN) 4 MG tablet Take 1 tablet (4 mg total) by mouth every 8 (eight) hours as needed for nausea or vomiting.  . rivaroxaban (XARELTO) 20 MG TABS tablet Take 20 mg by mouth daily with supper.  . rivaroxaban (XARELTO) 20 MG TABS tablet Take by mouth.    Allergies:   Shellfish allergy, Shellfish-derived products, and Shellfish allergy  Review of Systems (ROS): Review of Systems   Vital Signs: Today's Vitals   02/09/21 0945 02/09/21  1012  BP: 137/82   Pulse: 74   Resp: 18   Temp: 98.4 F (36.9 C)   TempSrc: Oral   SpO2: 99%   Weight: 268 lb (121.6 kg)   Height: 5\' 11"  (1.803 m)   PainSc: 0-No pain 0-No pain    Physical Exam: Physical Exam   Urgent Care Treatments / Results:   LABS: PLEASE NOTE: all labs that were ordered this encounter are listed, however only abnormal results are displayed. Labs Reviewed - No data to display  EKG: -None  RADIOLOGY: No results found.  PROCEDURES: Procedures  MEDICATIONS RECEIVED THIS VISIT: Medications - No data to display  PERTINENT CLINICAL COURSE NOTES/UPDATES:   Initial Impression / Assessment and Plan / Urgent Care Course:  Pertinent labs & imaging results that were available during my care of the patient were personally reviewed by me and considered in my medical decision making (see lab/imaging section of note for values and interpretations).  Terry Miles is a 30 y.o. male who presents to [facility] today with complaints of ***, diagnosed with ***, and treated as such with {Blank single:19197::"the medications below.","the procedure above.","the directions below."} NP and patient reviewed discharge instructions below during visit.   Patient is well appearing overall in clinic today. He does not appear to be in any acute distress. Presenting symptoms (see HPI) and exam as  documented above.   I have reviewed the follow up and strict return precautions for any new or worsening symptoms. Patient is aware of symptoms that would be deemed urgent/emergent, and would thus require further evaluation either here or in the emergency department. At the time of discharge, he verbalized understanding and consent with the discharge plan as it was reviewed with him. All questions were fielded by provider and/or clinic staff prior to patient discharge.    Final Clinical Impressions / Urgent Care Diagnoses:   Final diagnoses:  Alcohol withdrawal syndrome without  complication (HCC)    New Prescriptions:  [place] Controlled Substance Registry consulted? {CHL UC CONTROLLED SUBSTANCE:[id number]}  Meds ordered this encounter  Medications  . gabapentin (NEURONTIN) 300 MG capsule    Sig: Take 1 capsule (300 mg total) by mouth daily.    Dispense:  30 capsule    Refill:  1  . ondansetron (ZOFRAN) 4 MG tablet    Sig: Take 1 tablet (4 mg total) by mouth every 8 (eight) hours as needed for nausea or vomiting.    Dispense:  15 tablet    Refill:  1      Discharge Instructions     You were seen for panic attacks and are being treated for alcohol withdrawal.   Start taking your new medication, gabapentin, as prescribed.  It should help with your panic attacks, anxiety, and headaches.  You can use the Zofran as needed for nausea.  Have a conversation with your mother regarding outpatient management of your alcohol dependence.  Resources include [facility] and [facility] outpatient psychiatry.   Take care, Dr. Sharlet Salina, NP-c     Recommended Follow up Care:  Patient encouraged to follow up with the following provider within the specified time frame, or sooner as dictated by the severity of his symptoms. As always, he was instructed that for any urgent/emergent care needs, he should seek care either here or in the emergency department for more immediate evaluation.   Bailey Mech, DNP, NP-c

## 2021-02-09 NOTE — ED Triage Notes (Signed)
Patient states that around 3-4 days ago he started having panic attacks. States that he had been drinking heavily but decided to quit cold [place] around 6 days ago. Patient reports that he did drink yesterday and noticed that he was calm.

## 2021-02-09 NOTE — ED Provider Notes (Signed)
[facility] [place] [facility] - [place], [place]   Name: Terry Miles DOB: 1991/03/04 MRN: [id number] CSN: [id number] PCP: Jenell Milliner, MD  Arrival date and time:  02/09/21 0918  Chief Complaint:  Panic Attack   NOTE: Prior to seeing the patient today, I have reviewed the triage nursing documentation and vital signs. Clinical staff has updated patient's PMH/PSHx, current medication list, and drug allergies/intolerances to ensure comprehensive history available to assist in medical decision making.   History:   HPI: Terry Miles is a 30 y.o. male who presents today with complaints of panic attacks.  Patient states his pain attacks started approximately 5 days ago once he stopped drinking.  Patient used to drink throughout the day with tequila and whiskey being his usual choices of beverage.  He started drinking heavily in early 2022 at the start of the COVID-19 pandemic when he lost his job.  Now he drinks daily, with his binge drinking being on the days that he is not working.  He works Friday Saturday and Sunday and he does not drink on those days.  Patient stopped drinking approximately 5 days ago after he received abnormal lab results (elevated LDL) from his PCP.  He was determined to make some lifestyle changes therefore he stopped drinking, stopped drinking caffeine, and changed his overall diet.   These panic attacks are consistent throughout the day.  The only relief he has had during these consistent panic attacks or when he took a drink yesterday afternoon at the guidance of a friend who recently was discharged from alcohol rehab facility.  Upon drinking alcohol, his anxiety decreased.  CIWA score = 20: Nausea vomiting-1, tremor-3, paroxysmal sweats-2, anxiety-4, agitation-4, tactile disturbances-0, auditory disturbances-0, visual disturbances-2, headache-4, orientation-0.   Past Medical History:  Diagnosis Date  . Asthma   . Lactose intolerance   . Obesity   . Pulmonary emboli  [facility])     Past Surgical History:  Procedure Laterality Date  . NO PAST SURGERIES      Family History  Problem Relation Age of Onset  . Pulmonary embolism Mother   . Asthma Mother   . Hypertension Maternal Grandmother   . Heart disease Maternal Grandmother   . Kidney disease Maternal Grandmother     Social History   Tobacco Use  . Smoking status: Never Smoker  . Smokeless tobacco: Never Used  Vaping Use  . Vaping Use: Never used  Substance Use Topics  . Alcohol use: Yes  . Drug use: Yes    Types: Marijuana    There are no problems to display for this patient.   Home Medications:    Current Meds  Medication Sig  . gabapentin (NEURONTIN) 300 MG capsule Take 1 capsule (300 mg total) by mouth daily.  . ondansetron (ZOFRAN) 4 MG tablet Take 1 tablet (4 mg total) by mouth every 8 (eight) hours as needed for nausea or vomiting.  . rivaroxaban (XARELTO) 20 MG TABS tablet Take 20 mg by mouth daily with supper.  . rivaroxaban (XARELTO) 20 MG TABS tablet Take by mouth.    Allergies:   Shellfish allergy, Shellfish-derived products, and Shellfish allergy  Review of Systems (ROS): Review of Systems  Constitutional: Positive for diaphoresis. Negative for chills and fever.  Neurological: Positive for tremors and headaches. Negative for dizziness, seizures, syncope, speech difficulty and numbness.  Psychiatric/Behavioral: Positive for agitation. Negative for behavioral problems, confusion, decreased concentration, hallucinations, self-injury and suicidal ideas. The patient is nervous/anxious and is hyperactive.  Vital Signs: Today's Vitals   02/09/21 0945 02/09/21 1012  BP: 137/82   Pulse: 74   Resp: 18   Temp: 98.4 F (36.9 C)   TempSrc: Oral   SpO2: 99%   Weight: 268 lb (121.6 kg)   Height: 5\' 11"  (1.803 m)   PainSc: 0-No pain 0-No pain    Physical Exam: Physical Exam Vitals and nursing note reviewed.  Constitutional:      Appearance: Normal appearance.   Cardiovascular:     Rate and Rhythm: Normal rate and regular rhythm.     Pulses: Normal pulses.     Heart sounds: Normal heart sounds.  Pulmonary:     Effort: Pulmonary effort is normal.     Breath sounds: Normal breath sounds.  Skin:    General: Skin is warm and dry.  Neurological:     General: No focal deficit present.     Mental Status: He is alert and oriented to person, place, and time.  Psychiatric:        Mood and Affect: Mood normal.        Behavior: Behavior normal.      Urgent Care Treatments / Results:   LABS: PLEASE NOTE: all labs that were ordered this encounter are listed, however only abnormal results are displayed. Labs Reviewed - No data to display  EKG: -None  RADIOLOGY: No results found.  PROCEDURES: Procedures  MEDICATIONS RECEIVED THIS VISIT: Medications - No data to display  PERTINENT CLINICAL COURSE NOTES/UPDATES:   Initial Impression / Assessment and Plan / Urgent Care Course:  Pertinent labs & imaging results that were available during my care of the patient were personally reviewed by me and considered in my medical decision making (see lab/imaging section of note for values and interpretations).  Terry Miles is a 30 y.o. male who presents to [facility] today with complaints of panic attacks, diagnosed with alcohol withdrawal, and treated as such with the medications below. NP and patient reviewed discharge instructions below during visit.   Patient is well appearing overall in clinic today. He does not appear to be in any acute distress. Presenting symptoms (see HPI) and exam as documented above.   I have reviewed the follow up and strict return precautions for any new or worsening symptoms. Patient is aware of symptoms that would be deemed urgent/emergent, and would thus require further evaluation either here or in the emergency department. At the time of discharge, he verbalized understanding and consent with the discharge plan  as it was reviewed with him. All questions were fielded by provider and/or clinic staff prior to patient discharge.    Final Clinical Impressions / Urgent Care Diagnoses:   Final diagnoses:  Alcohol withdrawal syndrome without complication (HCC)    New Prescriptions:   Controlled Substance Registry consulted? Not Applicable  Meds ordered this encounter  Medications  . gabapentin (NEURONTIN) 300 MG capsule    Sig: Take 1 capsule (300 mg total) by mouth daily.    Dispense:  30 capsule    Refill:  1  . ondansetron (ZOFRAN) 4 MG tablet    Sig: Take 1 tablet (4 mg total) by mouth every 8 (eight) hours as needed for nausea or vomiting.    Dispense:  15 tablet    Refill:  1      Discharge Instructions     You were seen for panic attacks and are being treated for alcohol withdrawal.   Start taking your new medication, gabapentin, as prescribed.  It should help with your panic attacks, anxiety, and headaches.  You can use the Zofran as needed for nausea.  Have a conversation with your mother regarding outpatient management of your alcohol dependence.  Resources include [facility] and [facility] outpatient psychiatry.   Take care, Dr. Sharlet Salina, NP-c     Recommended Follow up Care:  Patient encouraged to follow up with the following provider within the specified time frame, or sooner as dictated by the severity of his symptoms. As always, he was instructed that for any urgent/emergent care needs, he should seek care either here or in the emergency department for more immediate evaluation.   Bailey Mech, DNP, NP-c    Bailey Mech, NP 02/09/21 [id number]

## 2021-02-14 DIAGNOSIS — F1010 Alcohol abuse, uncomplicated: Secondary | ICD-10-CM | POA: Insufficient documentation

## 2021-02-14 DIAGNOSIS — E785 Hyperlipidemia, unspecified: Secondary | ICD-10-CM | POA: Insufficient documentation

## 2021-04-08 DIAGNOSIS — Z789 Other specified health status: Secondary | ICD-10-CM | POA: Insufficient documentation

## 2022-01-30 DIAGNOSIS — R0683 Snoring: Secondary | ICD-10-CM | POA: Insufficient documentation

## 2022-10-02 ENCOUNTER — Ambulatory Visit
Admission: EM | Admit: 2022-10-02 | Discharge: 2022-10-02 | Disposition: A | Discharge status: Home / Self Care | Payer: BC Managed Care – PPO

## 2022-10-02 DIAGNOSIS — S61211A Laceration without foreign body of left index finger without damage to nail, initial encounter: Secondary | ICD-10-CM | POA: Diagnosis not present

## 2022-10-02 DIAGNOSIS — Z23 Encounter for immunization: Secondary | ICD-10-CM

## 2022-10-02 MED ORDER — TETANUS-DIPHTH-ACELL PERTUSSIS 5-2.5-18.5 LF-MCG/0.5 IM SUSY
0.5000 mL | PREFILLED_SYRINGE | Freq: Once | INTRAMUSCULAR | Status: AC
  Administered 2022-10-02: 0.5 mL via INTRAMUSCULAR

## 2022-10-02 NOTE — Discharge Instructions (Addendum)
Keep dressing in place x 24 hours.  Change dressing daily x3 days.  Apply antibacterial ointment, nonadherent dressing and Coban wrap.  After 3 days may leave wound uncovered.  Do not allow wound to be wet for 3 days.  After 3 days may gently wash wound with soap and water.  Do not soak wound.  Do not take a bath or submerge wound.  Follow up here or with your primary care provider for suture removal in 7-10 days.

## 2022-10-02 NOTE — ED Provider Notes (Signed)
[facility]    CSN: [id number] Arrival date & time: 10/02/22  1411      History   Chief Complaint Chief Complaint  Patient presents with   Hand Injury    HPI Terry Miles is a 31 y.o. male.    Hand Injury   Presents to urgent care with report of left finger laceration after injury with a box cutter.  Patient with history of pulmonary emboli and taking Xarelto.  Past Medical History:  Diagnosis Date   Asthma    Lactose intolerance    Obesity    Pulmonary emboli (HCC)     There are no problems to display for this patient.   Past Surgical History:  Procedure Laterality Date   NO PAST SURGERIES         Home Medications    Prior to Admission medications   Medication Sig Start Date End Date Taking? Authorizing Provider  albuterol (VENTOLIN HFA) 108 (90 Base) MCG/ACT inhaler Inhale into the lungs. 10/18/06  Yes [provider]  gabapentin (NEURONTIN) 300 MG capsule Take 1 capsule (300 mg total) by mouth daily. 02/09/21   Bailey Mech, NP  omeprazole (PRILOSEC) 20 MG capsule Take by mouth.    [provider]  ondansetron (ZOFRAN) 4 MG tablet Take 1 tablet (4 mg total) by mouth every 8 (eight) hours as needed for nausea or vomiting. 02/09/21   Bailey Mech, NP  rivaroxaban (XARELTO) 20 MG TABS tablet Take 20 mg by mouth daily with supper.    [provider]  rivaroxaban (XARELTO) 20 MG TABS tablet Take by mouth. 05/15/20   [provider]    Family History Family History  Problem Relation Age of Onset   Pulmonary embolism Mother    Asthma Mother    Hypertension Maternal Grandmother    Heart disease Maternal Grandmother    Kidney disease Maternal Grandmother     Social History Social History   Tobacco Use   Smoking status: Never   Smokeless tobacco: Never  Vaping Use   Vaping Use: Never used  Substance Use Topics   Alcohol use: Yes    Comment: social   Drug use: Yes    Types: Marijuana      Allergies   Shellfish allergy, Shellfish-derived products, and Shellfish allergy   Review of Systems Review of Systems   Physical Exam Triage Vital Signs ED Triage Vitals [10/02/22 1425]  Enc Vitals Group     BP      Pulse      Resp      Temp      Temp src      SpO2      Weight      Height      Head Circumference      Peak Flow      Pain Score 0     Pain Loc      Pain Edu?      Excl. in GC?    No data found.  Updated Vital Signs There were no vitals taken for this visit.  Visual Acuity Right Eye Distance:   Left Eye Distance:   Bilateral Distance:    Right Eye Near:   Left Eye Near:    Bilateral Near:     Physical Exam   UC Treatments / Results  Labs (all labs ordered are listed, but only abnormal results are displayed) Labs Reviewed - No data to display  EKG   Radiology No results found.  Procedures Laceration Repair  Date/Time: 10/02/2022 3:01 PM  Performed by: Rose Phi, FNP Authorized by: Rose Phi, FNP   Consent:    Consent obtained:  Verbal   Consent given by:  Patient   Risks, benefits, and alternatives were discussed: yes     Risks discussed:  Pain, poor cosmetic result and poor wound healing   Alternatives discussed:  No treatment Universal protocol:    Procedure explained and questions answered to patient or proxy's satisfaction: yes     Relevant documents present and verified: yes     Test results available: no     Imaging studies available: no     Required blood products, implants, devices, and special equipment available: no     Site/side marked: no     Immediately prior to procedure, a time out was called: yes     Patient identity confirmed:  Verbally with patient Anesthesia:    Anesthesia method:  Local infiltration   Local anesthetic:  Lidocaine 1% w/o epi Laceration details:    Location:  Finger   Finger location:  L index finger   Length (cm):  2   Depth (mm):  3 Exploration:    Limited  defect created (wound extended): no     Hemostasis achieved with:  Tourniquet and direct pressure   Imaging outcome: foreign body not noted     Wound exploration: wound explored through full range of motion     Wound extent: areolar tissue violated     Contaminated: no   Treatment:    Area cleansed with:  Povidone-iodine   Amount of cleaning:  Standard   Debridement:  None   Undermining:  Minimal   Scar revision: no   Skin repair:    Repair method:  Sutures   Suture size:  4-0   Suture material:  Prolene   Suture technique:  Simple interrupted   Number of sutures:  7 Approximation:    Approximation:  Close Repair type:    Repair type:  Simple Post-procedure details:    Dressing:  Antibiotic ointment and non-adherent dressing   Procedure completion:  Tolerated Comments:     Patient given instructions for wound care. Keep dressing in place x 24 hours.  Change dressing daily x3 days.  Apply antibacterial ointment, nonadherent dressing and Coban wrap.  After 3 days may leave wound uncovered.  Do not allow wound to be wet for 3 days.  After 3 days may gently wash wound with soap and water.  Do not soak wound.  Do not take a bath or submerge wound.  (including critical care time)  Medications Ordered in UC Medications - No data to display  Initial Impression / Assessment and Plan / UC Course  I have reviewed the triage vital signs and the nursing notes.  Pertinent labs & imaging results that were available during my care of the patient were reviewed by me and considered in my medical decision making (see chart for details).   See procedure documentation.   Final Clinical Impressions(s) / UC Diagnoses   Final diagnoses:  None   Discharge Instructions   None    ED Prescriptions   None    PDMP not reviewed this encounter.   Rose Phi, [place] 10/02/22 1505

## 2022-10-02 NOTE — ED Triage Notes (Signed)
Patient presents to UC for left finger laceration from a box cotter today at about 1120 today. Unsure if up to date on Tdap. Pt states he is on a blood thinner.

## 2023-01-01 DIAGNOSIS — F1011 Alcohol abuse, in remission: Secondary | ICD-10-CM | POA: Insufficient documentation

## 2023-01-01 DIAGNOSIS — K219 Gastro-esophageal reflux disease without esophagitis: Secondary | ICD-10-CM | POA: Insufficient documentation

## 2023-04-15 ENCOUNTER — Ambulatory Visit (HOSPITAL_COMMUNITY): Payer: Self-pay

## 2023-09-22 ENCOUNTER — Ambulatory Visit (HOSPITAL_COMMUNITY)
Admission: EM | Admit: 2023-09-22 | Discharge: 2023-09-22 | Disposition: A | Discharge status: Home / Self Care | Payer: BC Managed Care – PPO | Attending: Internal Medicine | Admitting: Internal Medicine

## 2023-09-22 ENCOUNTER — Encounter (HOSPITAL_COMMUNITY): Payer: Self-pay | Admitting: *Deleted

## 2023-09-22 DIAGNOSIS — Z202 Contact with and (suspected) exposure to infections with a predominantly sexual mode of transmission: Secondary | ICD-10-CM | POA: Diagnosis not present

## 2023-09-22 DIAGNOSIS — Z9189 Other specified personal risk factors, not elsewhere classified: Secondary | ICD-10-CM | POA: Insufficient documentation

## 2023-09-22 NOTE — Discharge Instructions (Signed)

## 2023-09-22 NOTE — ED Triage Notes (Signed)
Pt states a partner notified them they have chlamydia he would like to get tested. Denies an sx.

## 2023-09-22 NOTE — ED Provider Notes (Signed)
[facility]    CSN: [id number] Arrival date & time: 09/22/23  1445      History   Chief Complaint Chief Complaint  Patient presents with   SEXUALLY TRANSMITTED DISEASE    HPI Terry Miles is a 32 y.o. male.   Terry Miles is a 32 y.o. male presenting for chief complaint of STD testing. His recent sexual partner recently tested positive for chlamydia.  He did not have penetrative sexual intercourse with her but he did receive oral intercourse from this new male partner.  He would like to be tested for STDs today.  He is currently asymptomatic and denies recent antibiotic/steroid use.  No other recent known exposures to STDs or complaints.     Past Medical History:  Diagnosis Date   Asthma    Lactose intolerance    Obesity    Pulmonary emboli [facility])     Patient Active Problem List   Diagnosis Date Noted   Snoring 01/30/2022   Uncircumcised male 04/08/2021   Alcohol abuse 02/14/2021   Dyslipidemia 02/14/2021   Right carpal tunnel syndrome 05/07/2020   Mild intermittent asthma without complication 09/09/2019   Fatty liver 01/17/2019   Pulmonary nodules 01/17/2019   Anxiety 12/13/2018   Stress reaction 12/13/2018   History of pulmonary embolism 03/05/2018   Class 2 obesity in adult 06/23/2017    Past Surgical History:  Procedure Laterality Date   NO PAST SURGERIES         Home Medications    Prior to Admission medications   Medication Sig Start Date End Date Taking? Authorizing Provider  omeprazole (PRILOSEC) 20 MG capsule Take by mouth.   Yes [provider]  rivaroxaban (XARELTO) 20 MG TABS tablet Take 20 mg by mouth daily with supper.   Yes [provider]  albuterol (VENTOLIN HFA) 108 (90 Base) MCG/ACT inhaler Inhale into the lungs. 10/18/06   [provider]  gabapentin (NEURONTIN) 300 MG capsule Take 1 capsule (300 mg total) by mouth daily. 02/09/21   Bailey Mech, NP  ondansetron (ZOFRAN) 4 MG tablet  Take 1 tablet (4 mg total) by mouth every 8 (eight) hours as needed for nausea or vomiting. 02/09/21   Bailey Mech, NP  rivaroxaban (XARELTO) 20 MG TABS tablet Take by mouth. 05/15/20   [provider]    Family History Family History  Problem Relation Age of Onset   Pulmonary embolism Mother    Asthma Mother    Hypertension Maternal Grandmother    Heart disease Maternal Grandmother    Kidney disease Maternal Grandmother     Social History Social History   Tobacco Use   Smoking status: Never   Smokeless tobacco: Never  Vaping Use   Vaping status: Never Used  Substance Use Topics   Alcohol use: Yes    Comment: social   Drug use: Yes    Types: Marijuana     Allergies   Shellfish allergy   Review of Systems Review of Systems Per HPI  Physical Exam Triage Vital Signs ED Triage Vitals  Encounter Vitals Group     BP 09/22/23 1525 120/73     Systolic BP Percentile --      Diastolic BP Percentile --      Pulse Rate 09/22/23 1525 (!) 59     Resp 09/22/23 1525 18     Temp 09/22/23 1525 98.2 F (36.8 C)     Temp Source 09/22/23 1525 Oral     SpO2 09/22/23 1525  98 %     Weight --      Height --      Head Circumference --      Peak Flow --      Pain Score 09/22/23 1523 0     Pain Loc --      Pain Education --      Exclude from Growth Chart --    No data found.  Updated Vital Signs BP 120/73 (BP Location: Left Arm)   Pulse (!) 59   Temp 98.2 F (36.8 C) (Oral)   Resp 18   SpO2 98%   Visual Acuity Right Eye Distance:   Left Eye Distance:   Bilateral Distance:    Right Eye Near:   Left Eye Near:    Bilateral Near:     Physical Exam Vitals and nursing note reviewed.  Constitutional:      Appearance: He is not ill-appearing or toxic-appearing.  HENT:     Head: Normocephalic and atraumatic.     Right Ear: Hearing and external ear normal.     Left Ear: Hearing and external ear normal.     Nose: Nose normal.     Mouth/Throat:     Lips:  Pink.  Eyes:     General: Lids are normal. Vision grossly intact. Gaze aligned appropriately.     Extraocular Movements: Extraocular movements intact.     Conjunctiva/sclera: Conjunctivae normal.  Pulmonary:     Effort: Pulmonary effort is normal.  Genitourinary:    Comments: Deferred. Musculoskeletal:     Cervical back: Neck supple.  Skin:    General: Skin is warm and dry.     Capillary Refill: Capillary refill takes less than 2 seconds.     Findings: No rash.  Neurological:     General: No focal deficit present.     Mental Status: He is alert and oriented to person, place, and time. Mental status is at baseline.     Cranial Nerves: No dysarthria or facial asymmetry.  Psychiatric:        Mood and Affect: Mood normal.        Speech: Speech normal.        Behavior: Behavior normal.        Thought Content: Thought content normal.        Judgment: Judgment normal.      UC Treatments / Results  Labs (all labs ordered are listed, but only abnormal results are displayed) Labs Reviewed  CYTOLOGY, (ORAL, ANAL, URETHRAL) ANCILLARY ONLY    EKG   Radiology No results found.  Procedures Procedures (including critical care time)  Medications Ordered in UC Medications - No data to display  Initial Impression / Assessment and Plan / UC Course  I have reviewed the triage vital signs and the nursing notes.  Pertinent labs & imaging results that were available during my care of the patient were reviewed by me and considered in my medical decision making (see chart for details).   1.  At risk for sexually transmitted disease due to unprotected sex STI labs pending, will notify patient of positive results and treat accordingly per protocol when labs result.  Patient declines HIV and syphilis testing today.   Patient to avoid sexual intercourse until screening testing comes back.   Education provided regarding safe sexual practices and patient encouraged to use protection to  prevent spread of STIs.   Counseled patient on potential for adverse effects with medications prescribed/recommended today, strict ER and return-to-clinic precautions  discussed, patient verbalized understanding.    Final Clinical Impressions(s) / UC Diagnoses   Final diagnoses:  At risk for sexually transmitted disease due to unprotected sex     Discharge Instructions      STD testing pending, this will take 2-3 days to result. We will only call you if your testing is positive for any infection(s) and we will provide treatment.  Avoid sexual intercourse until your STD results come back.  If any of your STD results are positive, you will need to avoid sexual intercourse for 7 days while you are being treated to prevent spread of STD.  Condom use is the best way to prevent spread of STDs. Notify partner(s) of any positive results.  Return to urgent care as needed.    ED Prescriptions   None    PDMP not reviewed this encounter.   Carlisle Beers, [place] 09/22/23 [phone]

## 2023-09-23 LAB — CYTOLOGY, (ORAL, ANAL, URETHRAL) ANCILLARY ONLY
Chlamydia: NEGATIVE
Comment: NEGATIVE
Comment: NEGATIVE
Comment: NORMAL
Neisseria Gonorrhea: NEGATIVE
Trichomonas: NEGATIVE

## 2023-11-04 ENCOUNTER — Ambulatory Visit
Admission: EM | Admit: 2023-11-04 | Discharge: 2023-11-04 | Disposition: A | Discharge status: Home / Self Care | Payer: BC Managed Care – PPO | Attending: Emergency Medicine | Admitting: Emergency Medicine

## 2023-11-04 ENCOUNTER — Encounter: Payer: Self-pay | Admitting: Emergency Medicine

## 2023-11-04 DIAGNOSIS — Z113 Encounter for screening for infections with a predominantly sexual mode of transmission: Secondary | ICD-10-CM | POA: Insufficient documentation

## 2023-11-04 DIAGNOSIS — R3989 Other symptoms and signs involving the genitourinary system: Secondary | ICD-10-CM | POA: Insufficient documentation

## 2023-11-04 LAB — POCT URINALYSIS DIP (MANUAL ENTRY)
Bilirubin, UA: NEGATIVE
Blood, UA: NEGATIVE
Glucose, UA: NEGATIVE mg/dL
Ketones, POC UA: NEGATIVE mg/dL
Leukocytes, UA: NEGATIVE
Nitrite, UA: NEGATIVE
Protein Ur, POC: NEGATIVE mg/dL
Spec Grav, UA: 1.02 (ref 1.010–1.025)
Urobilinogen, UA: 1 U/dL
pH, UA: 7 (ref 5.0–8.0)

## 2023-11-04 NOTE — ED Provider Notes (Signed)
Renal[facility] Fiddler    CSN: [id number] Arrival date & time: 11/04/23  1036      History   Chief Complaint No chief complaint on file.   HPI Terry Miles is a 32 y.o. male.  Patient presents with intermittent "tingling" sensation at his urethral opening x 2 weeks.  The tingling is not associated with urinating.  He requests STD testing.  No fever, rash, lesions, dysuria, hematuria, abdominal pain, flank pain, penile discharge, testicular pain, or other symptoms.  He was seen at [facility] urgent care in [place] on 09/22/2023 and had negative tests for gonorrhea, chlamydia, trichomonas.  The history is provided by the patient and medical records.    Past Medical History:  Diagnosis Date   Asthma    Lactose intolerance    Obesity    Pulmonary emboli [facility])     Patient Active Problem List   Diagnosis Date Noted   Snoring 01/30/2022   Uncircumcised male 04/08/2021   Alcohol abuse 02/14/2021   Dyslipidemia 02/14/2021   Right carpal tunnel syndrome 05/07/2020   Mild intermittent asthma without complication 09/09/2019   Fatty liver 01/17/2019   Pulmonary nodules 01/17/2019   Anxiety 12/13/2018   Stress reaction 12/13/2018   History of pulmonary embolism 03/05/2018   Class 2 obesity in adult 06/23/2017    Past Surgical History:  Procedure Laterality Date   NO PAST SURGERIES         Home Medications    Prior to Admission medications   Medication Sig Start Date End Date Taking? Authorizing Provider  albuterol (VENTOLIN HFA) 108 (90 Base) MCG/ACT inhaler Inhale into the lungs. 10/18/06   [provider]  gabapentin (NEURONTIN) 300 MG capsule Take 1 capsule (300 mg total) by mouth daily. 02/09/21   Bailey Mech, NP  omeprazole (PRILOSEC) 20 MG capsule Take by mouth.    [provider]  ondansetron (ZOFRAN) 4 MG tablet Take 1 tablet (4 mg total) by mouth every 8 (eight) hours as needed for nausea or vomiting. 02/09/21   Bailey Mech, NP   rivaroxaban (XARELTO) 20 MG TABS tablet Take 20 mg by mouth daily with supper.    [provider]  rivaroxaban (XARELTO) 20 MG TABS tablet Take by mouth. 05/15/20   [provider]    Family History Family History  Problem Relation Age of Onset   Pulmonary embolism Mother    Asthma Mother    Hypertension Maternal Grandmother    Heart disease Maternal Grandmother    Kidney disease Maternal Grandmother     Social History Social History   Tobacco Use   Smoking status: Never   Smokeless tobacco: Never  Vaping Use   Vaping status: Never Used  Substance Use Topics   Alcohol use: Yes    Comment: social   Drug use: Yes    Types: Marijuana     Allergies   Shellfish allergy   Review of Systems Review of Systems  Constitutional:  Negative for chills and fever.  Gastrointestinal:  Negative for abdominal pain.  Genitourinary:  Negative for dysuria, flank pain, frequency, hematuria, penile discharge and testicular pain.  Skin:  Negative for color change, rash and wound.     Physical Exam Triage Vital Signs ED Triage Vitals  Encounter Vitals Group     BP      Systolic BP Percentile      Diastolic BP Percentile      Pulse      Resp      Temp  Temp src      SpO2      Weight      Height      Head Circumference      Peak Flow      Pain Score      Pain Loc      Pain Education      Exclude from Growth Chart    No data found.  Updated Vital Signs BP 131/78   Pulse 63   Temp 99.2 F (37.3 C) (Oral)   Resp 16   Ht 5\' 11"  (1.803 m)   Wt 245 lb (111.1 kg)   SpO2 98%   BMI 34.17 kg/m   Visual Acuity Right Eye Distance:   Left Eye Distance:   Bilateral Distance:    Right Eye Near:   Left Eye Near:    Bilateral Near:     Physical Exam Constitutional:      General: He is not in acute distress. HENT:     Mouth/Throat:     Mouth: Mucous membranes are moist.  Cardiovascular:     Rate and Rhythm: Normal rate and regular rhythm.      Heart sounds: Normal heart sounds.  Pulmonary:     Effort: Pulmonary effort is normal. No respiratory distress.     Breath sounds: Normal breath sounds.  Abdominal:     General: Bowel sounds are normal.     Palpations: Abdomen is soft.     Tenderness: There is no abdominal tenderness. There is no right CVA tenderness, left CVA tenderness, guarding or rebound.  Genitourinary:    Comments: Patient declines GU exam. Skin:    General: Skin is warm and dry.  Neurological:     Mental Status: He is alert.      UC Treatments / Results  Labs (all labs ordered are listed, but only abnormal results are displayed) Labs Reviewed  POCT URINALYSIS DIP (MANUAL ENTRY)  CYTOLOGY, (ORAL, ANAL, URETHRAL) ANCILLARY ONLY    EKG   Radiology No results found.  Procedures Procedures (including critical care time)  Medications Ordered in UC Medications - No data to display  Initial Impression / Assessment and Plan / UC Course  I have reviewed the triage vital signs and the nursing notes.  Pertinent labs & imaging results that were available during my care of the patient were reviewed by me and considered in my medical decision making (see chart for details).    STD screening, urethralgia.  Urine normal.  Patient obtained urethral self swab for testing.  Discussed that we will call him if it shows the need for treatment.  Instructed him to abstain from sexual activity until the test results are back.  Discussed that he and his sexual partner may require treatment at that time.  Instructed him to follow-up with his PCP.  He agrees to plan of care.  Final Clinical Impressions(s) / UC Diagnoses   Final diagnoses:  Screening for STD (sexually transmitted disease)  Urethralgia     Discharge Instructions      Your tests are pending.  If your test results are positive, we will call you.  You and your sexual partner(s) may require treatment at that time.  Do not have sexual activity for at  least 7 days.    Follow up with your primary care provider.          ED Prescriptions   None    PDMP not reviewed this encounter.   Mickie Bail, NP 11/04/23  1109  

## 2023-11-04 NOTE — Discharge Instructions (Signed)
Your tests are pending.  If your test results are positive, we will call you.  You and your sexual partner(s) may require treatment at that time.  Do not have sexual activity for at least 7 days.    Follow up with your primary care provider.    

## 2023-11-04 NOTE — ED Triage Notes (Signed)
Patient in office today requesting STD check. No sx but does feeling some tingling penis area.   OTC: soap change  Denies:discharge, pain

## 2023-11-05 LAB — CYTOLOGY, (ORAL, ANAL, URETHRAL) ANCILLARY ONLY
Chlamydia: NEGATIVE
Comment: NEGATIVE
Comment: NEGATIVE
Comment: NORMAL
Neisseria Gonorrhea: NEGATIVE
Trichomonas: NEGATIVE

## 2023-12-01 ENCOUNTER — Ambulatory Visit
Admission: EM | Admit: 2023-12-01 | Discharge: 2023-12-01 | Disposition: A | Discharge status: Home / Self Care | Payer: BC Managed Care – PPO | Attending: Emergency Medicine | Admitting: Emergency Medicine

## 2023-12-01 DIAGNOSIS — L02811 Cutaneous abscess of head [any part, except face]: Secondary | ICD-10-CM | POA: Diagnosis not present

## 2023-12-01 MED ORDER — DOXYCYCLINE HYCLATE 100 MG PO CAPS: 100.0000 mg | ORAL_CAPSULE | Freq: Two times a day (BID) | ORAL | 0 refills | Status: DC

## 2023-12-01 NOTE — ED Provider Notes (Signed)
Terry Miles    CSN: [id number] Arrival date & time: 12/01/23  1604      History   Chief Complaint No chief complaint on file.   HPI Terry Miles is a 32 y.o. male.   Patient presents for evaluation of a possible cyst behind the left ear present for 3 days.  Unsure of ingrown hair as he recently got haircut and has had 1 in the past in a different location.  area of concern has not gotten bigger.  Endorses pain only with palpation.  Has not attempted treatment.  Denies fever or drainage.   Past Medical History:  Diagnosis Date   Asthma    Lactose intolerance    Obesity    Pulmonary emboli [facility])     Patient Active Problem List   Diagnosis Date Noted   Snoring 01/30/2022   Uncircumcised male 04/08/2021   Alcohol abuse 02/14/2021   Dyslipidemia 02/14/2021   Right carpal tunnel syndrome 05/07/2020   Mild intermittent asthma without complication 09/09/2019   Fatty liver 01/17/2019   Pulmonary nodules 01/17/2019   Anxiety 12/13/2018   Stress reaction 12/13/2018   History of pulmonary embolism 03/05/2018   Class 2 obesity in adult 06/23/2017    Past Surgical History:  Procedure Laterality Date   NO PAST SURGERIES         Home Medications    Prior to Admission medications   Medication Sig Start Date End Date Taking? Authorizing Provider  doxycycline (VIBRAMYCIN) 100 MG capsule Take 1 capsule (100 mg total) by mouth 2 (two) times daily. 12/01/23  Yes Shontez Sermon R, NP  albuterol (VENTOLIN HFA) 108 (90 Base) MCG/ACT inhaler Inhale into the lungs. 10/18/06   [provider]  gabapentin (NEURONTIN) 300 MG capsule Take 1 capsule (300 mg total) by mouth daily. 02/09/21   Bailey Mech, NP  omeprazole (PRILOSEC) 20 MG capsule Take by mouth.    [provider]  ondansetron (ZOFRAN) 4 MG tablet Take 1 tablet (4 mg total) by mouth every 8 (eight) hours as needed for nausea or vomiting. 02/09/21   Bailey Mech, NP  rivaroxaban (XARELTO)  20 MG TABS tablet Take 20 mg by mouth daily with supper.    [provider]  rivaroxaban (XARELTO) 20 MG TABS tablet Take by mouth. 05/15/20   [provider]    Family History Family History  Problem Relation Age of Onset   Pulmonary embolism Mother    Asthma Mother    Hypertension Maternal Grandmother    Heart disease Maternal Grandmother    Kidney disease Maternal Grandmother     Social History Social History   Tobacco Use   Smoking status: Never   Smokeless tobacco: Never  Vaping Use   Vaping status: Never Used  Substance Use Topics   Alcohol use: Yes    Comment: social   Drug use: Yes    Types: Marijuana     Allergies   Shellfish allergy   Review of Systems Review of Systems   Physical Exam Triage Vital Signs ED Triage Vitals [12/01/23 1619]  Encounter Vitals Group     BP 125/74     Systolic BP Percentile      Diastolic BP Percentile      Pulse Rate 63     Resp 18     Temp 98.7 F (37.1 C)     Temp Source Oral     SpO2 98 %     Weight  Height      Head Circumference      Peak Flow      Pain Score      Pain Loc      Pain Education      Exclude from Growth Chart    No data found.  Updated Vital Signs BP 125/74 (BP Location: Left Arm)   Pulse 63   Temp 98.7 F (37.1 C) (Oral)   Resp 18   SpO2 98%   Visual Acuity Right Eye Distance:   Left Eye Distance:   Bilateral Distance:    Right Eye Near:   Left Eye Near:    Bilateral Near:     Physical Exam Constitutional:      Appearance: Normal appearance.  Eyes:     Extraocular Movements: Extraocular movements intact.  Pulmonary:     Effort: Pulmonary effort is normal.  Skin:    Comments: 0.5 x 0.5 cm immature cyst present to the hairline behind the left ear, tender to palpation, nondraining, no erythema noted  Neurological:     Mental Status: He is alert. Mental status is at baseline.      UC Treatments / Results  Labs (all labs ordered are listed, but only  abnormal results are displayed) Labs Reviewed - No data to display  EKG   Radiology No results found.  Procedures Procedures (including critical care time)  Medications Ordered in UC Medications - No data to display  Initial Impression / Assessment and Plan / UC Course  I have reviewed the triage vital signs and the nursing notes.  Pertinent labs & imaging results that were available during my care of the patient were reviewed by me and considered in my medical decision making (see chart for details).  Abscess of scalp  Immature abscess noted, unable to be drained today, discussed with patient, placed on doxycycline and recommended warm compresses to the affected area, advised to follow-up for nonhealing nondraining site for reevaluation Final Clinical Impressions(s) / UC Diagnoses   Final diagnoses:  Abscess of scalp     Discharge Instructions      Take doxycyline every morning and every evening for 7 days  Hold warm-hot compresses to affected area at least 4 times a day, this helps to facilitate draining, the more the better  Please return for evaluation for increased swelling, increased tenderness or pain, non healing site, non draining site, you begin to have fever or chills   We reviewed the etiology of recurrent abscesses of skin.  Skin abscesses are collections of pus within the dermis and deeper skin tissues. Skin abscesses manifest as painful, tender, fluctuant, and erythematous nodules, frequently surmounted by a pustule and surrounded by a rim of erythematous swelling.  Spontaneous drainage of purulent material may occur.  Fever can occur on occasion.    -Skin abscesses can develop in healthy individuals with no predisposing conditions other than skin or nasal carriage of Staphylococcus aureus.  Individuals in close contact with others who have active infection with skin abscesses are at increased risk which is likely to explain why twin brother has similar  episodes.   In addition, any process leading to a breach in the skin barrier can also predispose to the development of a skin abscesses, such as atopic dermatitis.      ED Prescriptions     Medication Sig Dispense Auth. Provider   doxycycline (VIBRAMYCIN) 100 MG capsule Take 1 capsule (100 mg total) by mouth 2 (two) times daily. 14 capsule Madigan Rosensteel,  Elita Boone, NP      PDMP not reviewed this encounter.   Valinda Hoar, [place] 12/01/23 [phone]

## 2023-12-01 NOTE — Discharge Instructions (Signed)
Take doxycyline every morning and every evening for 7 days   Hold warm-hot compresses to affected area at least 4 times a day, this helps to facilitate draining, the more the better  Please return for evaluation for increased swelling, increased tenderness or pain, non healing site, non draining site, you begin to have fever or chills   We reviewed the etiology of recurrent abscesses of skin.  Skin abscesses are collections of pus within the dermis and deeper skin tissues. Skin abscesses manifest as painful, tender, fluctuant, and erythematous nodules, frequently surmounted by a pustule and surrounded by a rim of erythematous swelling.  Spontaneous drainage of purulent material may occur.  Fever can occur on occasion.    -Skin abscesses can develop in healthy individuals with no predisposing conditions other than skin or nasal carriage of Staphylococcus aureus.  Individuals in close contact with others who have active infection with skin abscesses are at increased risk which is likely to explain why twin brother has similar episodes.   In addition, any process leading to a breach in the skin barrier can also predispose to the development of a skin abscesses, such as atopic dermatitis.

## 2024-03-21 ENCOUNTER — Ambulatory Visit (HOSPITAL_BASED_OUTPATIENT_CLINIC_OR_DEPARTMENT_OTHER): Admitting: Family Medicine

## 2024-04-26 ENCOUNTER — Ambulatory Visit (HOSPITAL_COMMUNITY)
Admission: EM | Admit: 2024-04-26 | Discharge: 2024-04-26 | Disposition: A | Discharge status: Home / Self Care | Attending: Family Medicine | Admitting: Family Medicine

## 2024-04-26 ENCOUNTER — Encounter (HOSPITAL_COMMUNITY): Payer: Self-pay

## 2024-04-26 DIAGNOSIS — Z113 Encounter for screening for infections with a predominantly sexual mode of transmission: Secondary | ICD-10-CM | POA: Diagnosis not present

## 2024-04-26 NOTE — ED Triage Notes (Signed)
 Pt requesting STD testing. Denies sx's or known exposure.

## 2024-04-26 NOTE — Discharge Instructions (Addendum)
 We have sent testing for sexually transmitted infections. We will notify you of any positive results once they are received. If required, we will prescribe any medications you might need.  Please refrain from all sexual activity for at least the next seven days.

## 2024-04-26 NOTE — ED Provider Notes (Signed)
  [facility]   [id number] 04/26/24 Arrival Time: 1630  ASSESSMENT & PLAN:  1. Screening for STDs (sexually transmitted diseases)    Declines HIV/RPR.   Discharge Instructions      We have sent testing for sexually transmitted infections. We will notify you of any positive results once they are received. If required, we will prescribe any medications you might need.  Please refrain from all sexual activity for at least the next seven days.     Pending: Labs Reviewed  CYTOLOGY, (ORAL, ANAL, URETHRAL) ANCILLARY ONLY    Will notify of any positive results.   Reviewed expectations re: course of current medical issues. Questions answered. Outlined signs and symptoms indicating need for more acute intervention. Patient verbalized understanding. After Visit Summary given.   SUBJECTIVE:  Terry Miles is a 33 y.o. male who requests STD screening. No symptoms.   OBJECTIVE:  Vitals:   04/26/24 1653  BP: 121/79  Pulse: 62  Resp: 18  Temp: 99 F (37.2 C)  TempSrc: Oral  SpO2: 97%     General appearance: alert, cooperative, appears stated age and no distress GU: deferred Psychological: alert and cooperative; normal mood and affect.    Labs Reviewed  CYTOLOGY, (ORAL, ANAL, URETHRAL) ANCILLARY ONLY    Allergies  Allergen Reactions   Shellfish Allergy Itching    Throat itching    Past Medical History:  Diagnosis Date   Asthma    Lactose intolerance    Obesity    Pulmonary emboli (HCC)    Family History  Problem Relation Age of Onset   Pulmonary embolism Mother    Asthma Mother    Hypertension Maternal Grandmother    Heart disease Maternal Grandmother    Kidney disease Maternal Grandmother    Social History   Socioeconomic History   Marital status: Single    Spouse name: Not on file   Number of children: Not on file   Years of education: Not on file   Highest education level: Not on file  Occupational History   Not on file   Tobacco Use   Smoking status: Never   Smokeless tobacco: Never  Vaping Use   Vaping status: Never Used  Substance and Sexual Activity   Alcohol use: Not Currently    Comment: social   Drug use: Not Currently    Types: Marijuana   Sexual activity: Yes    Birth control/protection: None  Other Topics Concern   Not on file  Social History Narrative   ** Merged History Encounter **       Social Drivers of Corporate investment banker Strain: Not on file  Food Insecurity: Not on file  Transportation Needs: Not on file  Physical Activity: Not on file  Stress: Not on file  Social Connections: Not on file  Intimate Partner Violence: Not on file           [place], MD 04/26/24 1815

## 2024-04-27 LAB — CYTOLOGY, (ORAL, ANAL, URETHRAL) ANCILLARY ONLY
Chlamydia: NEGATIVE
Comment: NEGATIVE
Comment: NEGATIVE
Comment: NORMAL
Neisseria Gonorrhea: NEGATIVE
Trichomonas: NEGATIVE

## 2025-01-09 ENCOUNTER — Encounter (HOSPITAL_BASED_OUTPATIENT_CLINIC_OR_DEPARTMENT_OTHER): Payer: Self-pay | Admitting: Family Medicine

## 2025-01-09 ENCOUNTER — Encounter (HOSPITAL_BASED_OUTPATIENT_CLINIC_OR_DEPARTMENT_OTHER): Payer: Self-pay

## 2025-01-09 ENCOUNTER — Ambulatory Visit (HOSPITAL_BASED_OUTPATIENT_CLINIC_OR_DEPARTMENT_OTHER): Admitting: Family Medicine

## 2025-01-09 VITALS — BP 126/76 | HR 60 | Resp 20 | Ht 71.0 in

## 2025-01-09 DIAGNOSIS — K219 Gastro-esophageal reflux disease without esophagitis: Secondary | ICD-10-CM | POA: Diagnosis not present

## 2025-01-09 DIAGNOSIS — Z86711 Personal history of pulmonary embolism: Secondary | ICD-10-CM

## 2025-01-09 DIAGNOSIS — J4520 Mild intermittent asthma, uncomplicated: Secondary | ICD-10-CM

## 2025-01-09 DIAGNOSIS — R0683 Snoring: Secondary | ICD-10-CM

## 2025-01-09 DIAGNOSIS — Z0000 Encounter for general adult medical examination without abnormal findings: Secondary | ICD-10-CM
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
## 2025-01-31 NOTE — Assessment & Plan Note (Signed)
 Currently on Xarelto for anticoagulation. - Continue Xarelto as prescribed.

## 2025-04-18 ENCOUNTER — Encounter (HOSPITAL_BASED_OUTPATIENT_CLINIC_OR_DEPARTMENT_OTHER): Admitting: Family Medicine
